# Patient Record
Sex: Female | Born: 1965 | Race: Black or African American | Hispanic: No | Smoking: Never smoker
Health system: Southern US, Community
[De-identification: ages and names within clinical notes are randomized; demographics above are authoritative.]

## PROBLEM LIST (undated history)

## (undated) DIAGNOSIS — Z973 Presence of spectacles and contact lenses: Secondary | ICD-10-CM

## (undated) DIAGNOSIS — R5383 Other fatigue: Secondary | ICD-10-CM

## (undated) HISTORY — DX: Other fatigue: R53.83

## (undated) HISTORY — DX: Presence of spectacles and contact lenses: Z97.3

---

## 2004-12-22 ENCOUNTER — Ambulatory Visit (HOSPITAL_COMMUNITY): Admission: RE | Admit: 2004-12-22 | Discharge: 2004-12-22 | Payer: Self-pay | Admitting: Obstetrics and Gynecology

## 2005-07-16 ENCOUNTER — Emergency Department (HOSPITAL_COMMUNITY): Admission: EM | Admit: 2005-07-16 | Discharge: 2005-07-16 | Payer: Self-pay | Admitting: Emergency Medicine

## 2005-09-05 ENCOUNTER — Encounter: Admission: RE | Admit: 2005-09-05 | Discharge: 2005-09-05 | Payer: Self-pay | Admitting: Family Medicine

## 2006-09-17 ENCOUNTER — Emergency Department (HOSPITAL_COMMUNITY): Admission: EM | Admit: 2006-09-17 | Discharge: 2006-09-17 | Payer: Self-pay | Admitting: Emergency Medicine

## 2006-12-10 ENCOUNTER — Encounter: Admission: RE | Admit: 2006-12-10 | Discharge: 2006-12-10 | Payer: Self-pay | Admitting: Family Medicine

## 2007-12-11 ENCOUNTER — Encounter: Admission: RE | Admit: 2007-12-11 | Discharge: 2007-12-11 | Payer: Self-pay | Admitting: Family Medicine

## 2008-12-12 ENCOUNTER — Encounter: Admission: RE | Admit: 2008-12-12 | Discharge: 2008-12-12 | Payer: Self-pay | Admitting: Family Medicine

## 2009-06-16 ENCOUNTER — Emergency Department (HOSPITAL_COMMUNITY): Admission: EM | Admit: 2009-06-16 | Discharge: 2009-06-16 | Payer: Self-pay | Admitting: Emergency Medicine

## 2009-12-13 ENCOUNTER — Encounter: Admission: RE | Admit: 2009-12-13 | Discharge: 2009-12-13 | Payer: Self-pay | Admitting: Family Medicine

## 2010-04-09 LAB — POCT I-STAT, CHEM 8
BUN: 11 mg/dL (ref 6–23)
Creatinine, Ser: 0.5 mg/dL (ref 0.4–1.2)
Glucose, Bld: 92 mg/dL (ref 70–99)
Sodium: 140 mEq/L (ref 135–145)
TCO2: 23 mmol/L (ref 0–100)

## 2010-11-01 ENCOUNTER — Inpatient Hospital Stay (INDEPENDENT_AMBULATORY_CARE_PROVIDER_SITE_OTHER)
Admission: RE | Admit: 2010-11-01 | Discharge: 2010-11-01 | Disposition: A | Discharge status: Home / Self Care | Payer: PRIVATE HEALTH INSURANCE | Source: Ambulatory Visit | Attending: Family Medicine | Admitting: Family Medicine

## 2010-11-01 DIAGNOSIS — M79609 Pain in unspecified limb: Secondary | ICD-10-CM

## 2010-11-02 LAB — URINALYSIS, ROUTINE W REFLEX MICROSCOPIC
Bilirubin Urine: NEGATIVE
Glucose, UA: NEGATIVE
Nitrite: NEGATIVE
Specific Gravity, Urine: 1.027
pH: 6

## 2010-11-02 LAB — POCT PREGNANCY, URINE: Operator id: 277751

## 2010-11-23 ENCOUNTER — Other Ambulatory Visit (HOSPITAL_COMMUNITY): Payer: Self-pay | Admitting: Family Medicine

## 2010-11-23 DIAGNOSIS — Z1231 Encounter for screening mammogram for malignant neoplasm of breast: Secondary | ICD-10-CM

## 2010-12-20 ENCOUNTER — Ambulatory Visit (HOSPITAL_COMMUNITY)
Admission: RE | Admit: 2010-12-20 | Discharge: 2010-12-20 | Disposition: A | Discharge status: Home / Self Care | Payer: PRIVATE HEALTH INSURANCE | Source: Ambulatory Visit | Attending: Family Medicine | Admitting: Family Medicine

## 2010-12-20 DIAGNOSIS — Z1231 Encounter for screening mammogram for malignant neoplasm of breast: Secondary | ICD-10-CM

## 2012-02-23 ENCOUNTER — Emergency Department (HOSPITAL_COMMUNITY)
Admission: EM | Admit: 2012-02-23 | Discharge: 2012-02-23 | Disposition: A | Discharge status: Home / Self Care | Payer: PRIVATE HEALTH INSURANCE | Attending: Emergency Medicine | Admitting: Emergency Medicine

## 2012-02-23 ENCOUNTER — Emergency Department (HOSPITAL_COMMUNITY): Payer: PRIVATE HEALTH INSURANCE

## 2012-02-23 ENCOUNTER — Encounter (HOSPITAL_COMMUNITY): Payer: Self-pay

## 2012-02-23 DIAGNOSIS — K8020 Calculus of gallbladder without cholecystitis without obstruction: Secondary | ICD-10-CM | POA: Insufficient documentation

## 2012-02-23 DIAGNOSIS — Z3202 Encounter for pregnancy test, result negative: Secondary | ICD-10-CM | POA: Insufficient documentation

## 2012-02-23 DIAGNOSIS — R109 Unspecified abdominal pain: Secondary | ICD-10-CM | POA: Insufficient documentation

## 2012-02-23 LAB — COMPREHENSIVE METABOLIC PANEL
ALT: 8 U/L (ref 0–35)
AST: 15 U/L (ref 0–37)
CO2: 26 mEq/L (ref 19–32)
Calcium: 8.9 mg/dL (ref 8.4–10.5)
Chloride: 102 mEq/L (ref 96–112)
GFR calc non Af Amer: >90 mL/min (ref >90)
Sodium: 136 mEq/L (ref 135–145)

## 2012-02-23 LAB — CBC WITH DIFFERENTIAL/PLATELET
Basophils Absolute: 0 10*3/uL (ref 0.0–0.1)
Eosinophils Relative: 1 % (ref 0–5)
Lymphocytes Relative: 16 % (ref 12–46)
Neutro Abs: 6.1 10*3/uL (ref 1.7–7.7)
Neutrophils Relative %: 77 % (ref 43–77)
Platelets: 226 10*3/uL (ref 150–400)
RDW: 13.5 % (ref 11.5–15.5)
WBC: 7.8 10*3/uL (ref 4.0–10.5)

## 2012-02-23 LAB — URINALYSIS, ROUTINE W REFLEX MICROSCOPIC
Leukocytes, UA: NEGATIVE
Nitrite: NEGATIVE
Protein, ur: NEGATIVE mg/dL
Urobilinogen, UA: 1 mg/dL (ref 0.0–1.0)

## 2012-02-23 LAB — URINE MICROSCOPIC-ADD ON

## 2012-02-23 MED ORDER — MORPHINE SULFATE 4 MG/ML IJ SOLN
6.0000 mg | Freq: Once | INTRAMUSCULAR | Status: AC
  Administered 2012-02-23: 6 mg via INTRAVENOUS
  Filled 2012-02-23: qty 2

## 2012-02-23 NOTE — ED Notes (Signed)
US at bedside

## 2012-02-23 NOTE — ED Notes (Signed)
Pt c/o "contraction-like" upper abdominal pain starting this morning.  Pain score 10/10.  Denies n/v/d.  Vitals are stable.

## 2012-02-23 NOTE — ED Notes (Signed)
RN to obtain labs with start of IV 

## 2012-02-23 NOTE — ED Provider Notes (Signed)
History     CSN: [id number]  Arrival date & time 02/23/12  4098   First MD Initiated Contact with Patient 02/23/12 [phone]      Chief Complaint  Patient presents with  . Abdominal Pain     The history is provided by the patient.   patient reports being awoken by upper abdominal pain that has been colicky in nature.  No nausea or vomiting.  No fevers or chills.  No urinary complaints.  No flank pain.  She reports her pain seems to be "contraction-like" in her upper abdomen.  The patient was asked to pressure on her stomach where it is most tender she points just to the left of her umbilicus.  No history of diverticulitis.  No medical problems for this patient.  No recent illness prior to today.  Her symptoms were abrupt in onset.  Her pain is mild to moderate at this time.  Nothing worsens or improves her pain.  Her pain continues to wax and wane  History reviewed. No pertinent past medical history.  History reviewed. No pertinent past surgical history.  History reviewed. No pertinent family history.  History  Substance Use Topics  . Smoking status: Never Smoker   . Smokeless tobacco: Not on file  . Alcohol Use: Yes     Comment: occasionally    OB History    Grav Para Term Preterm Abortions TAB SAB Ect Mult Living                  Review of Systems  Gastrointestinal: Positive for abdominal pain.  All other systems reviewed and are negative.    Allergies  Review of patient's allergies indicates no known allergies.  Home Medications  No current outpatient prescriptions on file.  BP 119/82  Pulse 104  Temp 98.1 F (36.7 C) (Oral)  Resp 18  SpO2 100%  LMP 02/10/2012  Physical Exam  Nursing note and vitals reviewed. Constitutional: She is oriented to person, place, and time. She appears well-developed and well-nourished. No distress.  HENT:  Head: Normocephalic and atraumatic.  Eyes: EOM are normal.  Neck: Normal range of motion.  Cardiovascular: Normal rate,  regular rhythm and normal heart sounds.   Pulmonary/Chest: Effort normal and breath sounds normal.  Abdominal: Soft. She exhibits no distension.       Mild left lower quadrant and left-sided abdominal tenderness.  No right upper quadrant tenderness.  No Murphy sign present.  Musculoskeletal: Normal range of motion.  Neurological: She is alert and oriented to person, place, and time.  Skin: Skin is warm and dry.  Psychiatric: She has a normal mood and affect. Judgment normal.    ED Course  Procedures (including critical care time)  Labs Reviewed  URINALYSIS, ROUTINE W REFLEX MICROSCOPIC - Abnormal; Notable for the following:    APPearance TURBID (*)     All other components within normal limits  CBC WITH DIFFERENTIAL - Abnormal; Notable for the following:    Hemoglobin 11.6 (*)     HCT 35.3 (*)     All other components within normal limits  COMPREHENSIVE METABOLIC PANEL - Abnormal; Notable for the following:    Potassium 3.4 (*)     All other components within normal limits  PREGNANCY, URINE  LIPASE, BLOOD  URINE MICROSCOPIC-ADD ON   US Abdomen Complete  02/23/2012  *RADIOLOGY REPORT*  Clinical Data:  Abdominal pain  COMPLETE ABDOMINAL ULTRASOUND  Comparison:  None.  Findings:  Gallbladder:  Note is made of an  approximately 2 mm nondependent structure with an otherwise normal gallbladder.  No gallbladder wall thickening or pericholecystic fluid.  Negative sonographic Murphy's sign.  Common bile duct:  Normal in size measuring 5 mm in diameter  Liver:  Homogeneous hepatic echotexture.  No discrete hepatic lesions.  No definite evidence of intrahepatic biliary ductal dilatation.  No ascites.  IVC:  Appears normal.  Pancreas:  Limited visualization of the pancreatic head and neck is normal.  Visualization of the pancreatic body and tail is obscured by bowel gas. No definite pancreatic ductal dilatation.  Spleen:  Normal in size measuring 5 cm in length  Right Kidney:  Normal cortical  thickness, echogenicity and size, measuring 9.9 cm in length.  No focal renal lesions.  No echogenic renal stones.  No urinary obstruction.  Left Kidney:  Normal cortical thickness, echogenicity and size, measuring 9.32 cm in length.  Incidental note is made of a prominent renal column Burtin, similar to the contralateral right kidney.  No focal renal lesions.  No echogenic renal stones.  No urinary obstruction.  Abdominal aorta:  No aneurysm identified.  IMPRESSION:  1.  No explanation for patient's abdominal pain.  Specifically, no evidence of cholecystitis or urinary obstruction. 2. Approximately 2 mm echogenic polyp verses adherent stone with an otherwise normal-appearing gallbladder.   Original Report Authenticated By: Tacey Ruiz, MD    I personally reviewed the imaging tests through PACS system I reviewed available ER/hospitalization records through the EMR   1. Abdominal pain   2. Cholelithiases       MDM  1:34 PM The patient feels much better at this time.  This may represent biliary colic.  General surgery followup.  Polyp versus gallstone.  LFTs and lipase normal.  The patient was crepitus and pain medicine.  Discharge home in good condition.        Lyanne Co, MD 02/23/12 [phone]

## 2012-02-23 NOTE — ED Notes (Signed)
Pt requested copy of pt summary

## 2012-02-26 ENCOUNTER — Ambulatory Visit: Payer: Self-pay

## 2012-02-28 ENCOUNTER — Other Ambulatory Visit (INDEPENDENT_AMBULATORY_CARE_PROVIDER_SITE_OTHER): Payer: Self-pay | Admitting: Surgery

## 2012-02-28 ENCOUNTER — Encounter (INDEPENDENT_AMBULATORY_CARE_PROVIDER_SITE_OTHER): Payer: Self-pay | Admitting: Surgery

## 2012-02-28 ENCOUNTER — Ambulatory Visit (INDEPENDENT_AMBULATORY_CARE_PROVIDER_SITE_OTHER): Payer: PRIVATE HEALTH INSURANCE | Admitting: Surgery

## 2012-02-28 VITALS — BP 124/70 | HR 72 | Temp 98.3°F | Resp 16 | Ht 64.5 in | Wt 129.4 lb

## 2012-02-28 DIAGNOSIS — R109 Unspecified abdominal pain: Secondary | ICD-10-CM

## 2012-02-28 DIAGNOSIS — K824 Cholesterolosis of gallbladder: Secondary | ICD-10-CM

## 2012-02-28 DIAGNOSIS — K8020 Calculus of gallbladder without cholecystitis without obstruction: Secondary | ICD-10-CM

## 2012-02-28 NOTE — Progress Notes (Signed)
Patient ID: Erin Singh, female   DOB: [birth year], 47 y.o.   MRN: [id number]  Chief Complaint  Patient presents with  . Cholelithiasis    HPI Erin Singh is a 47 y.o. female.  Referred by Dr. Azalia Bilis,  [facility] emergency department. Primary care physician is Dr. Viann Shove HPI This patient presents after recent emergency department visit. One week ago she had a large cheeseburger from [employer].  She awoke in the middle of the night with severe abdominal pain and bloating. She tried to self medicate with Pepto-Bismol but was brought to the emergency department around 8 AM. She was given some intravenous pain medication and the symptoms improved. She did have one episode of nausea vomiting but this was after the morphine. She denies any abdominal bloating. She denies any diarrhea. A couple of days later she had another cheeseburger and had some mild abdominal pain. She denies any jaundice or any changes in her bowel movements or urine.  Past Medical History  Diagnosis Date  . Fatigue     due to loss of sleep  . Wears glasses     History reviewed. No pertinent past surgical history.  Family History  Problem Relation Age of Onset  . Diabetes Mother   . Kidney failure Mother   . Other Mother     double amputee    Social History History  Substance Use Topics  . Smoking status: Never Smoker   . Smokeless tobacco: Never Used  . Alcohol Use: Yes     Comment: occasionally - rare    No Known Allergies  No current outpatient prescriptions on file.    Review of Systems Review of Systems  Constitutional: Negative for fever, chills and unexpected weight change.  HENT: Negative for hearing loss, congestion, sore throat, trouble swallowing and voice change.   Eyes: Negative for visual disturbance.  Respiratory: Negative for cough and wheezing.   Cardiovascular: Negative for chest pain, palpitations and leg swelling.  Gastrointestinal: Positive for nausea, vomiting,  abdominal pain and abdominal distention. Negative for diarrhea, constipation, blood in stool and anal bleeding.  Genitourinary: Negative for hematuria, vaginal bleeding and difficulty urinating.  Musculoskeletal: Negative for arthralgias.  Skin: Negative for rash and wound.  Neurological: Negative for seizures, syncope and headaches.  Hematological: Negative for adenopathy. Does not bruise/bleed easily.  Psychiatric/Behavioral: Negative for confusion.    Blood pressure 124/70, pulse 72, temperature 98.3 F (36.8 C), temperature source Temporal, resp. rate 16, height 5' 4.5" (1.638 m), weight 129 lb 6 oz (58.684 kg), last menstrual period 02/10/2012.  Physical Exam Physical Exam WDWN in NAD HEENT:  EOMI, sclera anicteric Neck:  No masses, no thyromegaly Lungs:  CTA bilaterally; normal respiratory effort CV:  Regular rate and rhythm; no murmurs Abd:  +bowel sounds, soft, non-tender, no masses Ext:  Well-perfused; no edema Skin:  Warm, dry; no sign of jaundice  Data Reviewed *RADIOLOGY REPORT*  Clinical Data: Abdominal pain  COMPLETE ABDOMINAL ULTRASOUND  Comparison: None.  Findings:  Gallbladder: Note is made of an approximately 2 mm nondependent  structure with an otherwise normal gallbladder. No gallbladder  wall thickening or pericholecystic fluid. Negative sonographic  Murphy's sign.  Common bile duct: Normal in size measuring 5 mm in diameter  Liver: Homogeneous hepatic echotexture. No discrete hepatic  lesions. No definite evidence of intrahepatic biliary ductal  dilatation. No ascites.  IVC: Appears normal.  Pancreas: Limited visualization of the pancreatic head and neck is  normal. Visualization of the  pancreatic body and tail is obscured  by bowel gas. No definite pancreatic ductal dilatation.  Spleen: Normal in size measuring 5 cm in length  Right Kidney: Normal cortical thickness, echogenicity and size,  measuring 9.9 cm in length. No focal renal lesions. No  echogenic  renal stones. No urinary obstruction.  Left Kidney: Normal cortical thickness, echogenicity and size,  measuring 9.32 cm in length. Incidental note is made of a  prominent renal column Burtin, similar to the contralateral right  kidney. No focal renal lesions. No echogenic renal stones. No  urinary obstruction.  Abdominal aorta: No aneurysm identified.  IMPRESSION:  1. No explanation for patient's abdominal pain. Specifically, no  evidence of cholecystitis or urinary obstruction.  2. Approximately 2 mm echogenic polyp verses adherent stone with an  otherwise normal-appearing gallbladder.  Original Report Authenticated By: Tacey Ruiz, MD   Lab Results  Component Value Date   WBC 7.8 02/23/2012   HGB 11.6* 02/23/2012   HCT 35.3* 02/23/2012   MCV 87.6 02/23/2012   PLT 226 02/23/2012   Lab Results  Component Value Date   CREATININE 0.65 02/23/2012   BUN 10 02/23/2012   NA 136 02/23/2012   K 3.4* 02/23/2012   CL 102 02/23/2012   CO2 26 02/23/2012   Lab Results  Component Value Date   ALT 8 02/23/2012   AST 15 02/23/2012   ALKPHOS 50 02/23/2012   BILITOT 0.3 02/23/2012     Assessment    Abdominal pain - possible adherent 2 mm gallstone vs. Gallbladder polyp.  It is unclear with this tiny stone or polyp is causing her symptoms. Her symptoms seem relatively atypical for gallbladder disease.   Plan     We will obtain a HIDA scan to see if she has any recurrence of her symptoms. We will speak with her after the HIDA scan is complete.        [name] 02/28/2012, 1:13 PM

## 2012-02-28 NOTE — Patient Instructions (Signed)
We will call you with the results of your HIDA scan.

## 2012-03-09 ENCOUNTER — Telehealth (INDEPENDENT_AMBULATORY_CARE_PROVIDER_SITE_OTHER): Payer: Self-pay | Admitting: General Surgery

## 2012-03-09 ENCOUNTER — Ambulatory Visit (HOSPITAL_COMMUNITY)
Admission: RE | Admit: 2012-03-09 | Discharge: 2012-03-09 | Disposition: A | Discharge status: Home / Self Care | Payer: PRIVATE HEALTH INSURANCE | Source: Ambulatory Visit | Attending: Surgery | Admitting: Surgery

## 2012-03-09 DIAGNOSIS — R109 Unspecified abdominal pain: Secondary | ICD-10-CM | POA: Insufficient documentation

## 2012-03-09 DIAGNOSIS — K8020 Calculus of gallbladder without cholecystitis without obstruction: Secondary | ICD-10-CM

## 2012-03-09 MED ORDER — TECHNETIUM TC 99M MEBROFENIN IV KIT
5.4000 | PACK | Freq: Once | INTRAVENOUS | Status: AC | PRN
  Administered 2012-03-09: 5 via INTRAVENOUS

## 2012-03-09 NOTE — Telephone Encounter (Addendum)
Called patient to let know that her that her HIDA scan was normal and I asked her about the symptoms from drinking the Ensure and she stated like it was drinking water that she drinks it all the time. She does want you to call her next week. She is aware that you may call Monday or Tuesday and she is ok with that. And I also told her if she wants to come in to call and ask for [facility]

## 2012-03-09 NOTE — Progress Notes (Signed)
Quick Note:  Please call the patient and let them know that their HIDA scan was normal. Ask her if she had any symptoms after drinking the Ensure. I can talk to her about this when I get back next week. If she wants to talk face to face, go ahead and schedule her for a follow-up visit.  Thanks  ______

## 2012-03-10 ENCOUNTER — Ambulatory Visit: Payer: Self-pay

## 2012-03-16 ENCOUNTER — Telehealth (INDEPENDENT_AMBULATORY_CARE_PROVIDER_SITE_OTHER): Payer: Self-pay | Admitting: General Surgery

## 2012-03-16 NOTE — Telephone Encounter (Signed)
[facility] for patient call back and ask for [facility]

## 2013-02-23 ENCOUNTER — Ambulatory Visit: Payer: Self-pay | Admitting: Family Medicine

## 2013-02-26 ENCOUNTER — Ambulatory Visit: Payer: Self-pay | Admitting: Family Medicine

## 2014-02-20 IMAGING — US US ABDOMEN COMPLETE
1 series · 13 of 25 positions shown · non-contrast
Comparison: None.

CLINICAL DATA: Abdominal pain

COMPLETE ABDOMINAL ULTRASOUND

[Series 1: us abdomen complete · 0.30mm/px · 13 of 65 slices shown]
[im 1/65]
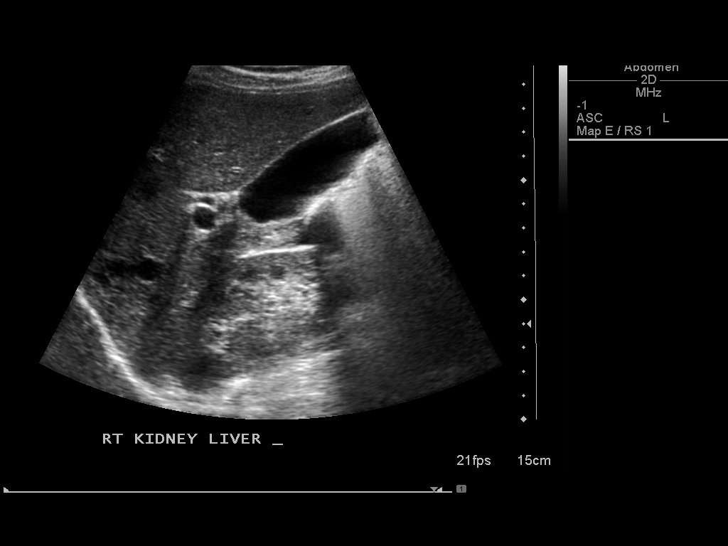
[im 6/65]
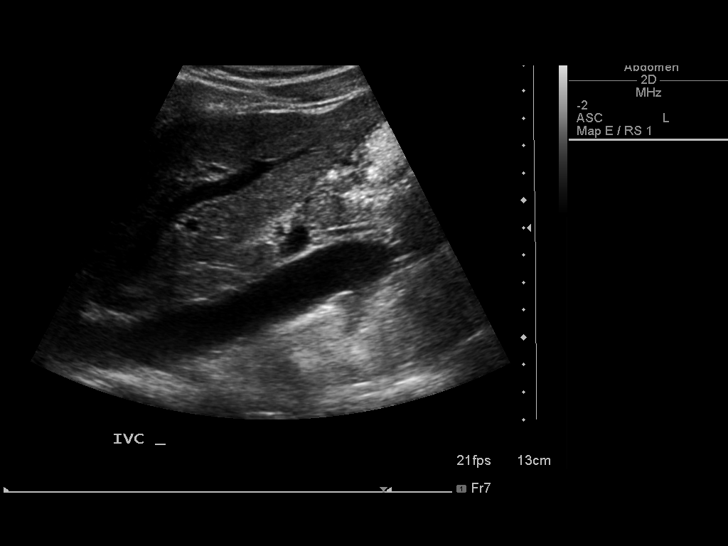
[im 11/65]
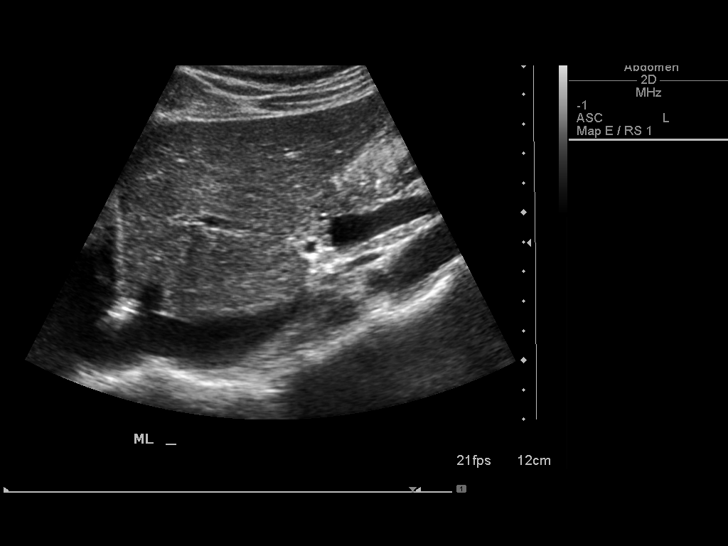
[im 17/65]
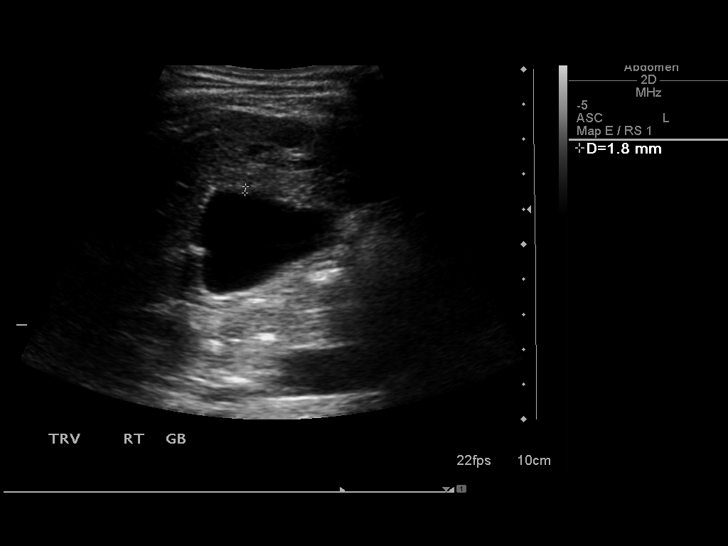
[im 22/65]
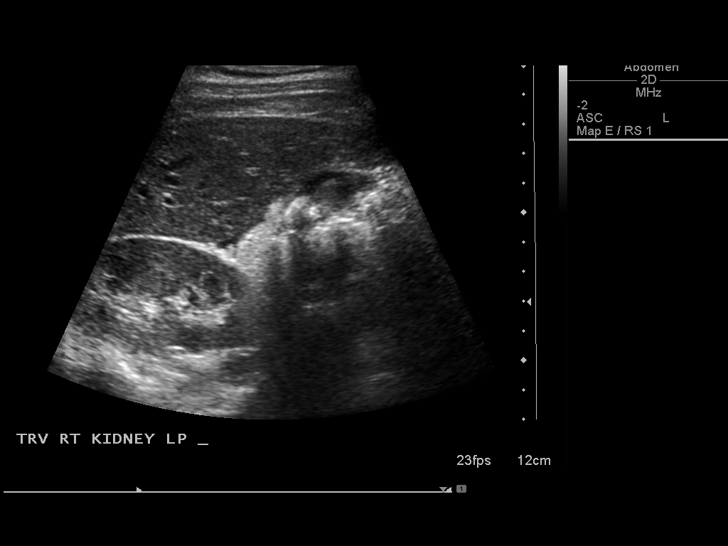
[im 27/65]
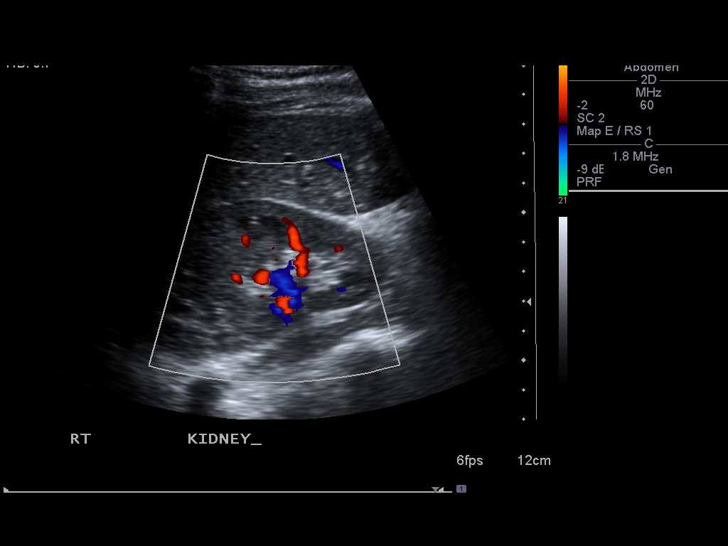
[im 33/65]
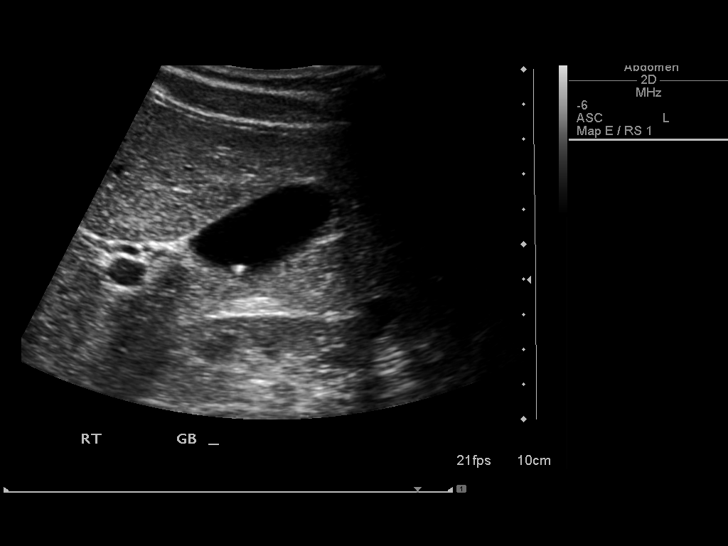
[im 38/65]
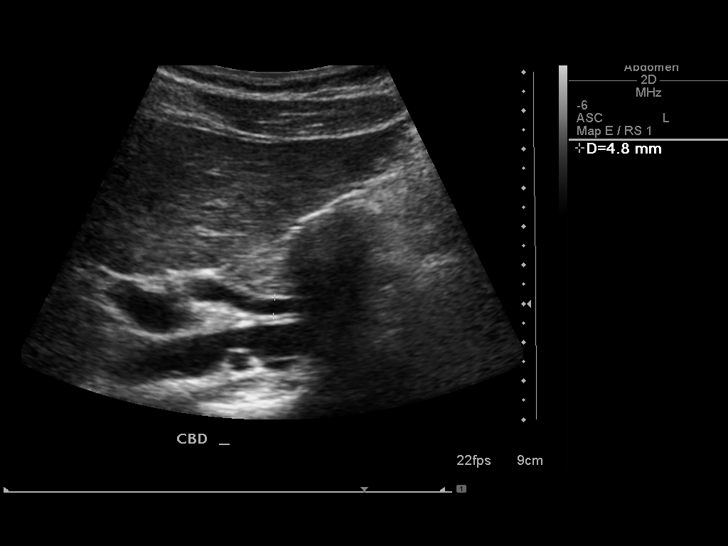
[im 43/65]
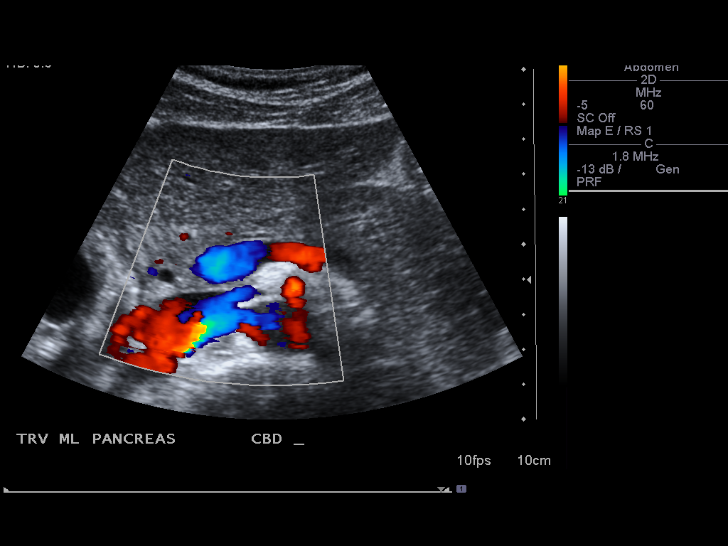
[im 49/65]
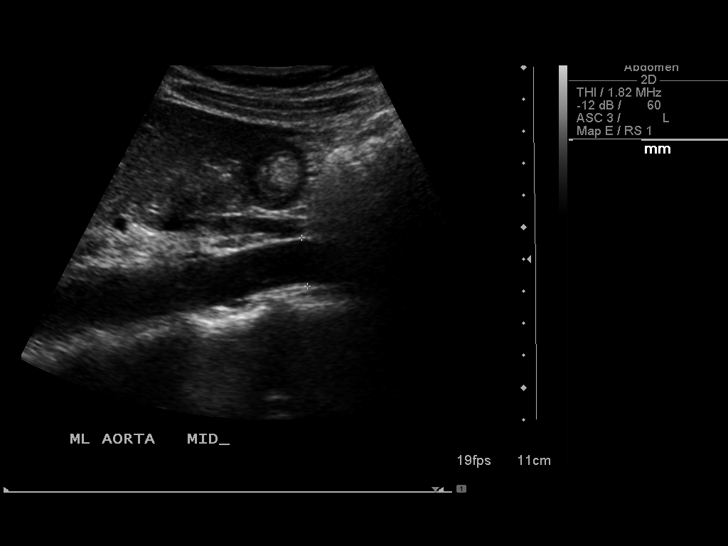
[im 54/65]
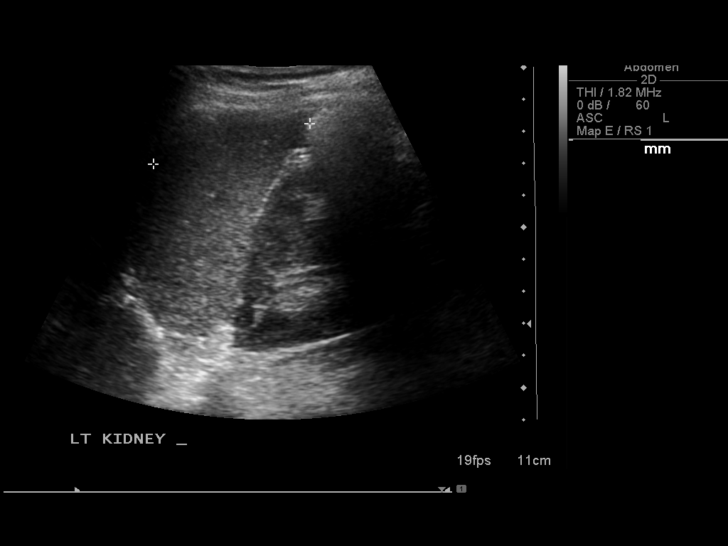
[im 59/65]
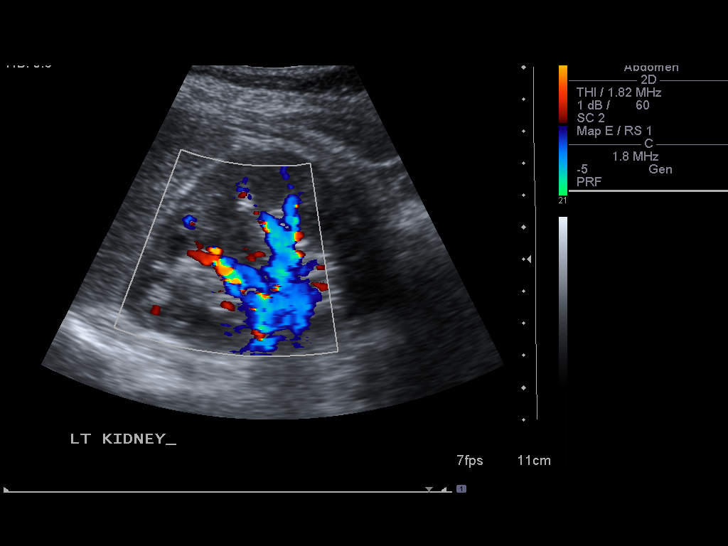
[im 65/65]
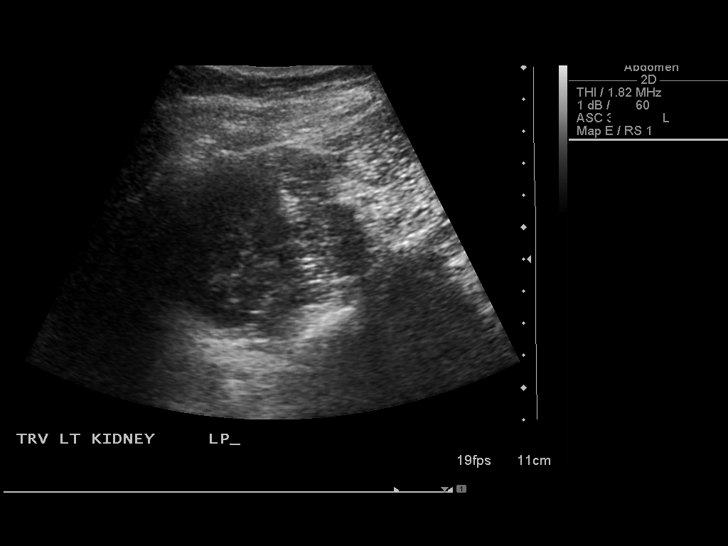

[13 of 25 positions shown; findings below may reference images not displayed]

FINDINGS: Gallbladder:  Note is made of an approximately 2 mm nondependent
structure with an otherwise normal gallbladder.  No gallbladder
wall thickening or pericholecystic fluid.  Negative sonographic
Murphy's sign.

Common bile duct:  Normal in size measuring 5 mm in diameter

Liver:  Homogeneous hepatic echotexture.  No discrete hepatic
lesions.  No definite evidence of intrahepatic biliary ductal
dilatation.  No ascites.

IVC:  Appears normal.

Pancreas:  Limited visualization of the pancreatic head and neck is
normal.  Visualization of the pancreatic body and tail is obscured
by bowel gas. No definite pancreatic ductal dilatation.

Spleen:  Normal in size measuring 5 cm in length

Right Kidney:  Normal cortical thickness, echogenicity and size,
measuring 9.9 cm in length.  No focal renal lesions.  No echogenic
renal stones.  No urinary obstruction.

Left Kidney:  Normal cortical thickness, echogenicity and size,
measuring 9.32 cm in length.  Incidental note is made of a
prominent renal column Zvirbule, similar to the contralateral right
kidney.  No focal renal lesions.  No echogenic renal stones.  No
urinary obstruction.

Abdominal aorta:  No aneurysm identified.
IMPRESSION: 1.  No explanation for patient's abdominal pain.  Specifically, no
evidence of cholecystitis or urinary obstruction.
2. Approximately 2 mm echogenic polyp verses adherent stone with an
otherwise normal-appearing gallbladder.

## 2014-02-22 ENCOUNTER — Other Ambulatory Visit (HOSPITAL_COMMUNITY): Payer: Self-pay | Admitting: Family Medicine

## 2014-02-22 DIAGNOSIS — Z1231 Encounter for screening mammogram for malignant neoplasm of breast: Secondary | ICD-10-CM

## 2014-02-28 ENCOUNTER — Ambulatory Visit (HOSPITAL_COMMUNITY)
Admission: RE | Admit: 2014-02-28 | Discharge: 2014-02-28 | Disposition: A | Discharge status: Home / Self Care | Payer: BLUE CROSS/BLUE SHIELD | Source: Ambulatory Visit | Attending: Family Medicine | Admitting: Family Medicine

## 2014-02-28 DIAGNOSIS — Z1231 Encounter for screening mammogram for malignant neoplasm of breast: Secondary | ICD-10-CM | POA: Insufficient documentation

## 2014-05-14 ENCOUNTER — Emergency Department (INDEPENDENT_AMBULATORY_CARE_PROVIDER_SITE_OTHER)
Admission: EM | Admit: 2014-05-14 | Discharge: 2014-05-14 | Disposition: A | Discharge status: Home / Self Care | Payer: BLUE CROSS/BLUE SHIELD | Source: Home / Self Care | Attending: Family Medicine | Admitting: Family Medicine

## 2014-05-14 ENCOUNTER — Encounter (HOSPITAL_COMMUNITY): Payer: Self-pay | Admitting: Emergency Medicine

## 2014-05-14 DIAGNOSIS — J309 Allergic rhinitis, unspecified: Secondary | ICD-10-CM | POA: Diagnosis not present

## 2014-05-14 MED ORDER — METHYLPREDNISOLONE ACETATE 80 MG/ML IJ SUSP
80.0000 mg | Freq: Once | INTRAMUSCULAR | Status: AC
  Administered 2014-05-14: 80 mg via INTRAMUSCULAR

## 2014-05-14 MED ORDER — KETOROLAC TROMETHAMINE 60 MG/2ML IM SOLN
INTRAMUSCULAR | Status: AC
  Filled 2014-05-14: qty 2

## 2014-05-14 MED ORDER — METHYLPREDNISOLONE ACETATE 80 MG/ML IJ SUSP
INTRAMUSCULAR | Status: AC
  Filled 2014-05-14: qty 1

## 2014-05-14 MED ORDER — KETOROLAC TROMETHAMINE 60 MG/2ML IM SOLN
60.0000 mg | Freq: Once | INTRAMUSCULAR | Status: AC
  Administered 2014-05-14: 60 mg via INTRAMUSCULAR

## 2014-05-14 NOTE — ED Provider Notes (Signed)
Erin CarbonKathy L Abraha McWilliams is a 49 y.o. female who presents to Urgent Care today for right-sided facial pain and pressure associated with headache. Symptoms present over the last few days. She's tried some over-the-counter medications which helped a little. She also notes sneezing and runny nose. She was treated recently for sinusitis with what sounds like a steroid intermuscular injection. No fevers or chills vomiting or diarrhea. No allergy medicines tried yet.   Past Medical History  Diagnosis Date  . Fatigue     due to loss of sleep  . Wears glasses    History reviewed. No pertinent past surgical history. History  Substance Use Topics  . Smoking status: Never Smoker   . Smokeless tobacco: Never Used  . Alcohol Use: Yes     Comment: occasionally - rare   ROS as above Medications: No current facility-administered medications for this encounter.   No current outpatient prescriptions on file.   No Known Allergies   Exam:  BP 144/60 mmHg  Pulse 69  Temp(Src) 98.3 F (36.8 C) (Oral)  Resp 16  LMP 01/31/2014 Gen: Well NAD HEENT: EOMI,  MMM inflamed nasal turbinates bilaterally Sinuses and frontal sinuses are nontender. Posterior pharynx with cobblestoning. Normal tympanic membranes bilaterally. Lungs: Normal work of breathing. CTABL Heart: RRR no MRG Abd: NABS, Soft. Nondistended, Nontender Exts: Brisk capillary refill, warm and well perfused.   Patient was given 60 mg IM Toradol, and 80 mg IM Depo-Medrol prior to discharge.  No results found for this or any previous visit (from the past 24 hour(s)). No results found.  Assessment and Plan: 49 y.o. female with allergic sinusitis and headache. Doubtful for bacterial sinusitis. Treat with medications as above as well as Rhinocort and Zyrtec.  Discussed warning signs or symptoms. Please see discharge instructions. Patient expresses understanding.     Rodolph BongEvan S Resha Filippone, MD 05/14/14 [phone]

## 2014-05-14 NOTE — ED Notes (Signed)
C/o headache States she has a nonproductive cough and sneezing States pain radiates down to back of head Was given a shot at primary office for same sx

## 2014-05-14 NOTE — Discharge Instructions (Signed)
Thank you for coming in today.  Use over the counter rhinocort and zyrtec.   Sinusitis Sinusitis is redness, soreness, and inflammation of the paranasal sinuses. Paranasal sinuses are air pockets within the bones of your face (beneath the eyes, the middle of the forehead, or above the eyes). In healthy paranasal sinuses, mucus is able to drain out, and air is able to circulate through them by way of your nose. However, when your paranasal sinuses are inflamed, mucus and air can become trapped. This can allow bacteria and other germs to grow and cause infection. Sinusitis can develop quickly and last only a short time (acute) or continue over a long period (chronic). Sinusitis that lasts for more than 12 weeks is considered chronic.  CAUSES  Causes of sinusitis include:  Allergies.  Structural abnormalities, such as displacement of the cartilage that separates your nostrils (deviated septum), which can decrease the air flow through your nose and sinuses and affect sinus drainage.  Functional abnormalities, such as when the small hairs (cilia) that line your sinuses and help remove mucus do not work properly or are not present. SIGNS AND SYMPTOMS  Symptoms of acute and chronic sinusitis are the same. The primary symptoms are pain and pressure around the affected sinuses. Other symptoms include:  Upper toothache.  Earache.  Headache.  Bad breath.  Decreased sense of smell and taste.  A cough, which worsens when you are lying flat.  Fatigue.  Fever.  Thick drainage from your nose, which often is green and may contain pus (purulent).  Swelling and warmth over the affected sinuses. DIAGNOSIS  Your health care provider will perform a physical exam. During the exam, your health care provider may:  Look in your nose for signs of abnormal growths in your nostrils (nasal polyps).  Tap over the affected sinus to check for signs of infection.  View the inside of your sinuses  (endoscopy) using an imaging device that has a light attached (endoscope). If your health care provider suspects that you have chronic sinusitis, one or more of the following tests may be recommended:  Allergy tests.  Nasal culture. A sample of mucus is taken from your nose, sent to a lab, and screened for bacteria.  Nasal cytology. A sample of mucus is taken from your nose and examined by your health care provider to determine if your sinusitis is related to an allergy. TREATMENT  Most cases of acute sinusitis are related to a viral infection and will resolve on their own within 10 days. Sometimes medicines are prescribed to help relieve symptoms (pain medicine, decongestants, nasal steroid sprays, or saline sprays).  However, for sinusitis related to a bacterial infection, your health care provider will prescribe antibiotic medicines. These are medicines that will help kill the bacteria causing the infection.  Rarely, sinusitis is caused by a fungal infection. In theses cases, your health care provider will prescribe antifungal medicine. For some cases of chronic sinusitis, surgery is needed. Generally, these are cases in which sinusitis recurs more than 3 times per year, despite other treatments. HOME CARE INSTRUCTIONS   Drink plenty of water. Water helps thin the mucus so your sinuses can drain more easily.  Use a humidifier.  Inhale steam 3 to 4 times a day (for example, sit in the bathroom with the shower running).  Apply a warm, moist washcloth to your face 3 to 4 times a day, or as directed by your health care provider.  Use saline nasal sprays to help moisten  and clean your sinuses.  Take medicines only as directed by your health care provider.  If you were prescribed either an antibiotic or antifungal medicine, finish it all even if you start to feel better. SEEK IMMEDIATE MEDICAL CARE IF:  You have increasing pain or severe headaches.  You have nausea, vomiting, or  drowsiness.  You have swelling around your face.  You have vision problems.  You have a stiff neck.  You have difficulty breathing. MAKE SURE YOU:   Understand these instructions.  Will watch your condition.  Will get help right away if you are not doing well or get worse. Document Released: 01/07/2005 Document Revised: 05/24/2013 Document Reviewed: 01/22/2011 [facility] Patient Information 2015 [place], [place]. This information is not intended to replace advice given to you by your health care provider. Make sure you discuss any questions you have with your health care provider.

## 2014-11-20 ENCOUNTER — Emergency Department (HOSPITAL_COMMUNITY)
Admission: EM | Admit: 2014-11-20 | Discharge: 2014-11-21 | Disposition: A | Discharge status: Home / Self Care | Payer: BLUE CROSS/BLUE SHIELD | Attending: Emergency Medicine | Admitting: Emergency Medicine

## 2014-11-20 ENCOUNTER — Encounter (HOSPITAL_COMMUNITY): Payer: Self-pay | Admitting: Family Medicine

## 2014-11-20 DIAGNOSIS — Z3202 Encounter for pregnancy test, result negative: Secondary | ICD-10-CM | POA: Diagnosis not present

## 2014-11-20 DIAGNOSIS — Z008 Encounter for other general examination: Secondary | ICD-10-CM | POA: Diagnosis present

## 2014-11-20 DIAGNOSIS — Z0000 Encounter for general adult medical examination without abnormal findings: Secondary | ICD-10-CM

## 2014-11-20 LAB — CBC
HCT: 32.4 % — ABNORMAL LOW (ref 36.0–46.0)
HEMOGLOBIN: 10.4 g/dL — AB (ref 12.0–15.0)
MCH: 26.7 pg (ref 26.0–34.0)
MCHC: 32.1 g/dL (ref 30.0–36.0)
MCV: 83.3 fL (ref 78.0–100.0)
PLATELETS: 270 10*3/uL (ref 150–400)
RBC: 3.89 MIL/uL (ref 3.87–5.11)
RDW: 15.1 % (ref 11.5–15.5)
WBC: 5.5 10*3/uL (ref 4.0–10.5)
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
## 2014-11-20 NOTE — ED Notes (Addendum)
Pt belonging: shirt, pants, [place] id, cell phone, shoe
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
## 2014-11-21 LAB — RAPID URINE DRUG SCREEN, HOSP PERFORMED
Amphetamines: NOT DETECTED
BARBITURATES: NOT DETECTED
BENZODIAZEPINES: NOT DETECTED
COCAINE: NOT DETECTED
Opiates: NOT DETECTED
TETRAHYDROCANNABINOL: NOT DETECTED

## 2014-11-21 LAB — ETHANOL

## 2014-11-21 LAB — COMPREHENSIVE METABOLIC PANEL
ALK PHOS: 61 U/L (ref 38–126)
ALT: 11 U/L — AB (ref 14–54)
AST: 19 U/L (ref 15–41)
Albumin: 4.5 g/dL (ref 3.5–5.0)
Anion gap: 6 (ref 5–15)
BUN: 10 mg/dL (ref 6–20)
CALCIUM: 8.7 mg/dL — AB (ref 8.9–10.3)
CHLORIDE: 108 mmol/L (ref 101–111)
CO2: 22 mmol/L (ref 22–32)
Creatinine, Ser: 0.65 mg/dL (ref 0.44–1.00)
Glucose, Bld: 121 mg/dL — ABNORMAL HIGH (ref 65–99)
Potassium: 3.5 mmol/L (ref 3.5–5.1)
Sodium: 136 mmol/L (ref 135–145)
Total Bilirubin: 0.6 mg/dL (ref 0.3–1.2)
Total Protein: 8.2 g/dL — ABNORMAL HIGH (ref 6.5–8.1)

## 2014-11-21 LAB — ACETAMINOPHEN LEVEL: Acetaminophen (Tylenol), Serum: <10 ug/mL — ABNORMAL LOW (ref 10–30)

## 2014-11-21 LAB — POC URINE PREG, ED: PREG TEST UR: NEGATIVE

## 2014-11-21 LAB — SALICYLATE LEVEL
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
## 2014-11-21 NOTE — Discharge Instructions (Signed)
Follow up a mental health counselor if you feel you need it. Look at the list to get a provider. Return to the ED if you feel you are going to hurt yourself or someone else.

## 2014-11-21 NOTE — BH Assessment (Signed)
Assessment completed. Consulted Hulan FessIjeoma Nwaeze, NP who recommended that pt be evaluated by psychiatry in the morning. Informed Midge AverSabrina Sam, PA-C of recommendation.

## 2015-03-07 ENCOUNTER — Other Ambulatory Visit: Payer: Self-pay | Admitting: Family Medicine

## 2015-03-07 DIAGNOSIS — Z1231 Encounter for screening mammogram for malignant neoplasm of breast: Secondary | ICD-10-CM

## 2015-03-16 ENCOUNTER — Ambulatory Visit
Admission: RE | Admit: 2015-03-16 | Discharge: 2015-03-16 | Disposition: A | Discharge status: Home / Self Care | Payer: BLUE CROSS/BLUE SHIELD | Source: Ambulatory Visit | Attending: Family Medicine | Admitting: Family Medicine

## 2015-03-16 DIAGNOSIS — Z1231 Encounter for screening mammogram for malignant neoplasm of breast: Secondary | ICD-10-CM

## 2016-02-09 ENCOUNTER — Other Ambulatory Visit: Payer: Self-pay | Admitting: Family Medicine

## 2016-02-09 DIAGNOSIS — Z1231 Encounter for screening mammogram for malignant neoplasm of breast: Secondary | ICD-10-CM

## 2016-03-06 ENCOUNTER — Ambulatory Visit: Payer: Self-pay

## 2016-03-07 ENCOUNTER — Ambulatory Visit
Admission: RE | Admit: 2016-03-07 | Discharge: 2016-03-07 | Disposition: A | Discharge status: Home / Self Care | Payer: BLUE CROSS/BLUE SHIELD | Source: Ambulatory Visit | Attending: Family Medicine | Admitting: Family Medicine

## 2016-03-07 DIAGNOSIS — Z1231 Encounter for screening mammogram for malignant neoplasm of breast: Secondary | ICD-10-CM

## 2016-10-16 ENCOUNTER — Encounter: Payer: Self-pay | Admitting: Internal Medicine

## 2016-10-16 NOTE — Progress Notes (Signed)
Received labs from 10/11/2016:  - Glu 76, BUN/Cr 25/1.58, eGFR 41 - Lipids: 165/84/67/81 - vit D 39.2

## 2017-01-27 ENCOUNTER — Other Ambulatory Visit: Payer: Self-pay | Admitting: Family Medicine

## 2017-01-27 DIAGNOSIS — Z1231 Encounter for screening mammogram for malignant neoplasm of breast: Secondary | ICD-10-CM

## 2017-03-14 ENCOUNTER — Ambulatory Visit
Admission: RE | Admit: 2017-03-14 | Discharge: 2017-03-14 | Disposition: A | Discharge status: Home / Self Care | Payer: BLUE CROSS/BLUE SHIELD | Source: Ambulatory Visit | Attending: Family Medicine | Admitting: Family Medicine

## 2017-03-14 DIAGNOSIS — Z1231 Encounter for screening mammogram for malignant neoplasm of breast: Secondary | ICD-10-CM

## 2019-03-09 ENCOUNTER — Other Ambulatory Visit: Payer: Self-pay | Admitting: Family Medicine

## 2019-03-09 DIAGNOSIS — Z1231 Encounter for screening mammogram for malignant neoplasm of breast: Secondary | ICD-10-CM

## 2019-04-16 ENCOUNTER — Other Ambulatory Visit: Payer: Self-pay

## 2019-04-16 ENCOUNTER — Ambulatory Visit
Admission: RE | Admit: 2019-04-16 | Discharge: 2019-04-16 | Disposition: A | Discharge status: Home / Self Care | Payer: BC Managed Care – PPO | Source: Ambulatory Visit | Attending: Family Medicine | Admitting: Family Medicine

## 2019-04-16 DIAGNOSIS — Z1231 Encounter for screening mammogram for malignant neoplasm of breast: Secondary | ICD-10-CM

## 2020-03-13 ENCOUNTER — Other Ambulatory Visit: Payer: Self-pay | Admitting: Family Medicine

## 2020-03-13 DIAGNOSIS — Z1231 Encounter for screening mammogram for malignant neoplasm of breast: Secondary | ICD-10-CM

## 2020-05-05 ENCOUNTER — Other Ambulatory Visit: Payer: Self-pay

## 2020-05-05 ENCOUNTER — Ambulatory Visit
Admission: RE | Admit: 2020-05-05 | Discharge: 2020-05-05 | Disposition: A | Discharge status: Home / Self Care | Payer: 59 | Source: Ambulatory Visit | Attending: Family Medicine | Admitting: Family Medicine

## 2020-05-05 DIAGNOSIS — Z1231 Encounter for screening mammogram for malignant neoplasm of breast: Secondary | ICD-10-CM

## 2021-03-15 ENCOUNTER — Other Ambulatory Visit: Payer: Self-pay | Admitting: Physician Assistant

## 2021-03-15 DIAGNOSIS — Z1231 Encounter for screening mammogram for malignant neoplasm of breast: Secondary | ICD-10-CM

## 2021-05-08 ENCOUNTER — Ambulatory Visit
Admission: RE | Admit: 2021-05-08 | Discharge: 2021-05-08 | Disposition: A | Discharge status: Home / Self Care | Payer: 59 | Source: Ambulatory Visit | Attending: Physician Assistant | Admitting: Physician Assistant

## 2021-05-08 DIAGNOSIS — Z1231 Encounter for screening mammogram for malignant neoplasm of breast: Secondary | ICD-10-CM

## 2022-04-10 ENCOUNTER — Other Ambulatory Visit: Payer: Self-pay | Admitting: Physician Assistant

## 2022-04-10 DIAGNOSIS — Z1231 Encounter for screening mammogram for malignant neoplasm of breast: Secondary | ICD-10-CM

## 2022-05-24 ENCOUNTER — Ambulatory Visit: Payer: 59

## 2022-06-20 ENCOUNTER — Ambulatory Visit
Admission: RE | Admit: 2022-06-20 | Discharge: 2022-06-20 | Disposition: A | Discharge status: Home / Self Care | Payer: 59 | Source: Ambulatory Visit | Attending: Physician Assistant | Admitting: Physician Assistant

## 2022-06-20 DIAGNOSIS — Z1231 Encounter for screening mammogram for malignant neoplasm of breast: Secondary | ICD-10-CM

## 2022-11-27 ENCOUNTER — Emergency Department
Admission: EM | Admit: 2022-11-27 | Discharge: 2022-11-27 | Disposition: A | Discharge status: Home / Self Care | Payer: 59 | Attending: Emergency Medicine | Admitting: Emergency Medicine

## 2022-11-27 ENCOUNTER — Other Ambulatory Visit: Payer: Self-pay

## 2022-11-27 ENCOUNTER — Encounter: Payer: Self-pay | Admitting: Emergency Medicine

## 2022-11-27 DIAGNOSIS — Y92410 Unspecified street and highway as the place of occurrence of the external cause: Secondary | ICD-10-CM | POA: Insufficient documentation

## 2022-11-27 DIAGNOSIS — T148XXA Other injury of unspecified body region, initial encounter: Secondary | ICD-10-CM | POA: Insufficient documentation

## 2022-11-27 DIAGNOSIS — M7918 Myalgia, other site: Secondary | ICD-10-CM

## 2022-11-27 MED ORDER — NAPROXEN 500 MG PO TABS: 500.0000 mg | ORAL_TABLET | Freq: Two times a day (BID) | ORAL | 0 refills | Status: AC

## 2022-11-27 MED ORDER — CYCLOBENZAPRINE HCL 5 MG PO TABS: 5.0000 mg | ORAL_TABLET | Freq: Three times a day (TID) | ORAL | 0 refills | Status: DC | PRN
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
## 2023-10-09 ENCOUNTER — Other Ambulatory Visit: Payer: Self-pay | Admitting: Physician Assistant

## 2023-10-09 DIAGNOSIS — Z1231 Encounter for screening mammogram for malignant neoplasm of breast: Secondary | ICD-10-CM

## 2023-10-17 ENCOUNTER — Encounter

## 2023-10-19 ENCOUNTER — Other Ambulatory Visit: Payer: Self-pay

## 2023-10-19 ENCOUNTER — Emergency Department: Payer: MEDICAID

## 2023-10-19 ENCOUNTER — Emergency Department
Admission: EM | Admit: 2023-10-19 | Discharge: 2023-10-20 | Disposition: A | Discharge status: Home / Self Care | Payer: MEDICAID | Attending: Emergency Medicine | Admitting: Emergency Medicine

## 2023-10-19 DIAGNOSIS — R1900 Intra-abdominal and pelvic swelling, mass and lump, unspecified site: Secondary | ICD-10-CM | POA: Insufficient documentation

## 2023-10-19 DIAGNOSIS — R112 Nausea with vomiting, unspecified: Secondary | ICD-10-CM | POA: Insufficient documentation

## 2023-10-19 DIAGNOSIS — R1084 Generalized abdominal pain: Secondary | ICD-10-CM | POA: Insufficient documentation

## 2023-10-19 LAB — CBC
HCT: 35.1 % — ABNORMAL LOW (ref 36.0–46.0)
Hemoglobin: 11.7 g/dL — ABNORMAL LOW (ref 12.0–15.0)
MCH: 28.5 pg (ref 26.0–34.0)
MCHC: 33.3 g/dL (ref 30.0–36.0)
MCV: 85.6 fL (ref 80.0–100.0)
Platelets: 227 K/uL (ref 150–400)
RBC: 4.1 MIL/uL (ref 3.87–5.11)
RDW: 13.1 % (ref 11.5–15.5)
WBC: 7.2 K/uL (ref 4.0–10.5)
nRBC: 0 % (ref 0.0–0.2)

## 2023-10-19 LAB — COMPREHENSIVE METABOLIC PANEL WITH GFR
ALT: 9 U/L (ref 0–44)
AST: 19 U/L (ref 15–41)
Albumin: 3.9 g/dL (ref 3.5–5.0)
Alkaline Phosphatase: 101 U/L (ref 38–126)
Anion gap: 8 (ref 5–15)
BUN: 9 mg/dL (ref 6–20)
CO2: 26 mmol/L (ref 22–32)
Calcium: 9 mg/dL (ref 8.9–10.3)
Chloride: 105 mmol/L (ref 98–111)
Creatinine, Ser: 0.8 mg/dL (ref 0.44–1.00)
GFR, Estimated: >60 mL/min (ref >60)
Glucose, Bld: 165 mg/dL — ABNORMAL HIGH (ref 70–99)
Potassium: 3.3 mmol/L — ABNORMAL LOW (ref 3.5–5.1)
Sodium: 139 mmol/L (ref 135–145)
Total Bilirubin: 0.6 mg/dL (ref 0.0–1.2)
Total Protein: 8.4 g/dL — ABNORMAL HIGH (ref 6.5–8.1)

## 2023-10-19 LAB — URINALYSIS, ROUTINE W REFLEX MICROSCOPIC
Bacteria, UA: NONE SEEN
Bilirubin Urine: NEGATIVE
Glucose, UA: NEGATIVE mg/dL
Hgb urine dipstick: NEGATIVE
Ketones, ur: NEGATIVE mg/dL
Nitrite: NEGATIVE
Protein, ur: NEGATIVE mg/dL
Specific Gravity, Urine: 1.031 — ABNORMAL HIGH (ref 1.005–1.030)
pH: 8 (ref 5.0–8.0)

## 2023-10-19 LAB — LIPASE, BLOOD: Lipase: 15 U/L (ref 11–51)

## 2023-10-19 MED ORDER — FENTANYL CITRATE PF 50 MCG/ML IJ SOSY
50.0000 ug | PREFILLED_SYRINGE | Freq: Once | INTRAMUSCULAR | Status: AC
  Administered 2023-10-19: 50 ug via INTRAVENOUS
  Filled 2023-10-19: qty 1

## 2023-10-19 MED ORDER — ONDANSETRON 4 MG PO TBDP: 4.0000 mg | ORAL_TABLET | Freq: Three times a day (TID) | ORAL | 0 refills | Status: AC | PRN

## 2023-10-19 MED ORDER — SODIUM CHLORIDE 0.9 % IV SOLN
12.5000 mg | Freq: Four times a day (QID) | INTRAVENOUS | Status: DC | PRN
  Administered 2023-10-19: 12.5 mg via INTRAVENOUS
  Filled 2023-10-19: qty 12.5

## 2023-10-19 MED ORDER — IOHEXOL 300 MG/ML  SOLN
100.0000 mL | Freq: Once | INTRAMUSCULAR | Status: AC | PRN
  Administered 2023-10-19: 100 mL via INTRAVENOUS

## 2023-10-19 MED ORDER — HYDROCODONE-ACETAMINOPHEN 5-325 MG PO TABS: 1.0000 | ORAL_TABLET | Freq: Four times a day (QID) | ORAL | 0 refills | Status: AC | PRN

## 2023-10-19 MED ORDER — ONDANSETRON HCL 4 MG/2ML IJ SOLN
4.0000 mg | Freq: Once | INTRAMUSCULAR | Status: AC
  Administered 2023-10-19: 4 mg via INTRAVENOUS
  Filled 2023-10-19: qty 2

## 2023-10-19 MED ORDER — SODIUM CHLORIDE 0.9 % IV BOLUS
1000.0000 mL | Freq: Once | INTRAVENOUS | Status: AC
  Administered 2023-10-19: 1000 mL via INTRAVENOUS

## 2023-10-19 MED ORDER — MORPHINE SULFATE (PF) 4 MG/ML IV SOLN
4.0000 mg | Freq: Once | INTRAVENOUS | Status: AC
  Administered 2023-10-19: 4 mg via INTRAVENOUS
  Filled 2023-10-19 (×2): qty 1

## 2023-10-19 NOTE — ED Provider Notes (Signed)
 [facility] Provider Note    Event Date/Time   First MD Initiated Contact with Patient 10/19/23 1940     (approximate)   History   Abdominal Pain   HPI  Erin Singh is a 58 y.o. female with no significant past medical history and as listed in EMR presents to the emergency department for treatment and evaluation of abdominal pain with nausea and vomiting that started around 3 this afternoon after eating mozzarella sticks.  This has happened to her in the past.  Abdominal pain is diffuse.  She denies fever or diarrhea.    Physical Exam    Vitals:   10/19/23 1917 10/19/23 1918  BP:  124/74  Pulse: 67   Resp: 20   Temp: 97.6 F (36.4 C)   SpO2: 100%     General: Awake, no distress.  CV:  Good peripheral perfusion.  Resp:  Normal effort.  Abd:  No distention.  Diffuse tenderness to palpation Other:     ED Results / Procedures / Treatments   Labs (all labs ordered are listed, but only abnormal results are displayed)  Labs Reviewed  COMPREHENSIVE METABOLIC PANEL WITH GFR - Abnormal; Notable for the following components:      Result Value   Potassium 3.3 (*)    Glucose, Bld 165 (*)    Total Protein 8.4 (*)    All other components within normal limits  CBC - Abnormal; Notable for the following components:   Hemoglobin 11.7 (*)    HCT 35.1 (*)    All other components within normal limits  URINALYSIS, ROUTINE W REFLEX MICROSCOPIC - Abnormal; Notable for the following components:   Color, Urine COLORLESS (*)    APPearance CLEAR (*)    Specific Gravity, Urine 1.031 (*)    Leukocytes,Ua MODERATE (*)    All other components within normal limits  LIPASE, BLOOD     EKG  Not indicated   RADIOLOGY  Image and radiology report reviewed and interpreted by me. Radiology report consistent with the same.  CT abdomen and pelvis with contrast shows left pelvic round mass with coarse calcification measuring 6.3 x 6.0 x 5.8 cm of uncertain  origin.  Ultrasound shows heterogenous mass in the left posterior lower uterine segment/cervix measuring 5.1 cm likely an exophytic uterine fibroid.  PROCEDURES:  Critical Care performed: No  Procedures   MEDICATIONS ORDERED IN ED:  Medications  promethazine (PHENERGAN) 12.5 mg in sodium chloride 0.9 % 50 mL IVPB (12.5 mg Intravenous New Bag/Given 10/19/23 2345)  sodium chloride 0.9 % bolus 1,000 mL (0 mLs Intravenous Stopped 10/19/23 2345)  ondansetron (ZOFRAN) injection 4 mg (4 mg Intravenous Given 10/19/23 1957)  fentaNYL (SUBLIMAZE) injection 50 mcg (50 mcg Intravenous Given 10/19/23 2040)  iohexol (OMNIPAQUE) 300 MG/ML solution 100 mL (100 mLs Intravenous Contrast Given 10/19/23 2027)  morphine  (PF) 4 MG/ML injection 4 mg (4 mg Intravenous Given 10/19/23 2213)  fentaNYL (SUBLIMAZE) injection 50 mcg (50 mcg Intravenous Given 10/19/23 2340)     IMPRESSION / MDM / ASSESSMENT AND PLAN / ED COURSE   I have reviewed the triage note and vital signs. Vital signs stable   Differential diagnosis includes, but is not limited to, acute gastroenteritis, colitis, influenza, intra-abdominal infection, small bowel obstruction  Patient's presentation is most consistent with acute presentation with potential threat to life or bodily function.  58 year old female presenting to the emergency department for treatment and evaluation of nausea vomiting and abdominal pain after eating mozzarella sticks.  See HPI for further details.  On exam, abdominal pain is diffuse.  Plan will be to give her some IV fluids, nausea medication, pain medication, and get a CT abdomen pelvis with contrast..  Clinical Course as of 10/19/23 2358  Sun Oct 19, 2023  2105 Pelvic mass of unknown origin identified on CT of the abdomen and pelvis with contrast.  Radiologist recommends further evaluation with pelvic ultrasound.  Results discussed with the patient  and husband.  Ultrasound and additional pain medication ordered.  [CT]  2345 CBC is reassuring.  CMP shows a mild hypokalemia but otherwise normal.  Lipase is normal.  Urinalysis shows moderate leukocytes but without white blood cells, bacteria, or nitrates.  Pelvic ultrasound reveals Heterogeneous mass in the left posterior lower uterine segment/cervix measuring 5.1 cm, favored to represent an exophytic uterine fibroid.  Results discussed with the patient and husband.  Patient again feeling nauseated and is vomiting.  Phenergan ordered.  She is also requesting another dose of pain medication prior to discharge since the pharmacies are closed. She will be referred to OB/GYN for further evaluation of pelvic mass. [CT]  2357 Significant improvement after medications.  Patient feels ready for discharge. [CT]    Clinical Course User Index [CT] Danni Leabo B, FNP     FINAL CLINICAL IMPRESSION(S) / ED DIAGNOSES   Final diagnoses:  Generalized abdominal pain  Nausea and vomiting, unspecified vomiting type  Pelvic mass     Rx / DC Orders   ED Discharge Orders          Ordered    ondansetron (ZOFRAN-ODT) 4 MG disintegrating tablet  Every 8 hours PRN        10/19/23 2354    HYDROcodone-acetaminophen  (NORCO/VICODIN) 5-325 MG tablet  Every 6 hours PRN        10/19/23 2354             Note:  This document was prepared using Dragon voice recognition software and may include unintentional dictation errors.   Herlinda Kirk NOVAK, FNP 10/19/23 JERRLYN    Dicky Anes, MD 10/20/23 [phone]

## 2023-10-19 NOTE — ED Triage Notes (Signed)
 Pt reports abd pain with n/v that began today after eating mozz sticks. Pt reports similar episode in past after eating mozz sticks.

## 2023-10-19 NOTE — Discharge Instructions (Signed)
 Please call and schedule follow-up appointment with your primary care provider and gynecology.  While taking the pain medication, be aware that it may make you drowsy or dizzy and you should not drive or perform any other dangerous activities for at least 8 hours after your last dose.  Return to the emergency department if your symptoms change or worsen and you are unable to see primary care or the specialist.

## 2023-10-20 NOTE — ED Notes (Signed)
 PT in no acute distress prior to discharge. Discharged instructions reviewed, all questions answered and pt verbalized understanding at this time. Pt has all belongings with them at time of discharge.

## 2023-10-24 NOTE — Telephone Encounter (Signed)
 Referral placed. Thank you

## 2023-11-25 NOTE — Progress Notes (Signed)
 Computer technology was used to create visit note. Consent from the patient/caregiver was obtained prior to its use.  Patient ID:  Erin Singh is a 58 y.o. (DOB 1965/06/10) female.  Assessment and Plan   1. Epigastric pain  CBC And Differential   Comprehensive Metabolic Panel  2. Pelvic mass    3. Elevated alkaline phosphatase level  Gamma GT  4. Transaminitis  Acetaminophen  Level   Ethanol   Acute Viral Hepatitis (HAV, HBV, HCV)   Assessment & Plan 1. and 2. Abdominal pain: - Pain near the breastbone with slight breathing difficulty. - Tenderness in the abdomen RUQ without severe pain. - Patient has a known pelvis mass that has appeared suggestive of a fibroid on past imaging. There is low suspicion that this is contributing to today's pain. - Ordered CBC and CMP, results expected tomorrow. - Recommend bland diet with low-fat foods and OTC antacids like Prilosec or Nexium.  3. and 4. Elevated alkaline phosphatase and transaminitis. - The day after today's encounter, patient's CMP resulted with elevated alkaline phosphatase of 244, elevated AST at 120, and elevated ALT at 213. - Chart review shows that patient has not experienced transaminitis at least for the last 7 years, and patient does have a persistently elevated alkaline phosphatase for the last 3 years but the baseline is approximately 140. - Etiology unknown. This is a new problem with an uncertain prognosis. - Differential diagnosis: Cholecystitis, hepatitis, and acute liver injury. - GGT, acetaminophen  level, ethanol, and acute viral hepatitis labs added. See result notes for communication to patient after results are received. - Also ordered a right upper quadrant ultrasound for further evaluation.   Risks, benefits, and alternatives of the medications and treatment plan prescribed today were discussed, and patient expressed understanding.    Patient's Medications       * Accurate as of November 25, 2023  11:59 PM. Reflects encounter med changes as of last refresh          Discontinued Medications    cetirizine 10 mg chewable tablet Commonly known as: ZYRTEC,ALL DAY ALLERGY Stopped by: Yvonna Gamble, PA   cyclobenzaprine  5 mg tablet Commonly known as: FLEXERIL  Stopped by: Yvonna Gamble, PA   naproxen  500 mg tablet Commonly known as: NAPROSYN  Stopped by: Yvonna Gamble, PA   ondansetron  4 mg disintegrating tablet Commonly known as: ZOFRAN -ODT Stopped by: Yvonna Gamble, PA        Orders Placed This Encounter  Procedures  . CBC And Differential  . Comprehensive Metabolic Panel      Subjective   Patient ID:  Erin Singh is a 58 y.o. (DOB October 01, 1965) female    Patient presents with  . Abdominal Pain    Upper abdominal pain favoring right side     HPI:  History of Present Illness The patient, a 58 year old female, presents with abdominal pain.  Abdominal Pain - The pain, initially diffuse, has localized to the substernal region, with onset one week ago. - She reports mild dyspnea without severe pain. - The patient recalls experiencing severe generalized abdominal pain in late September or early October 2025, which she attributes to a fibroid, though no surgical intervention was required.  Thermoregulation and Nausea - She describes fluctuating thermoregulation. - Occasional nausea, but denies vomiting, diarrhea, or constipation.  Urine and Hydration - The patient observed dark urine indicative of dehydration, which improved with increased water intake.  Family History - She expresses concern about potential diabetes, given her family history.  Occupation: Works  in logistics at a company on [employer] Diet: Mashed potatoes, greens, frozen foods, applesauce, chicken tenders; avoids spicy foods  FAMILY HISTORY Diabetes runs in the family.   Reviewed and updated this visit by provider: Tobacco  Allergies  Meds  Problems  Med Hx   Surg Hx  Fam Hx        ROS: Review of Systems   Objective   Vitals: BP 116/76 (BP Location: Left Upper Arm, Patient Position: Sitting)   Pulse 87   Temp 97.2 F (36.2 C) (Temporal)   Ht 5' 4.5 (1.638 m)   Wt 160 lb 6.4 oz (72.8 kg)   SpO2 98%   BMI 27.11 kg/m   Physical Exam: Physical Exam  *Some images could not be shown.

## 2023-12-26 ENCOUNTER — Encounter (HOSPITAL_COMMUNITY): Payer: Self-pay

## 2023-12-26 ENCOUNTER — Emergency Department

## 2023-12-26 ENCOUNTER — Other Ambulatory Visit: Payer: Self-pay

## 2023-12-26 ENCOUNTER — Emergency Department: Admission: EM | Admit: 2023-12-26 | Discharge: 2023-12-27 | Disposition: A

## 2023-12-26 DIAGNOSIS — K8040 Calculus of bile duct with cholecystitis, unspecified, without obstruction: Secondary | ICD-10-CM | POA: Insufficient documentation

## 2023-12-26 DIAGNOSIS — A419 Sepsis, unspecified organism: Principal | ICD-10-CM

## 2023-12-26 DIAGNOSIS — K8050 Calculus of bile duct without cholangitis or cholecystitis without obstruction: Secondary | ICD-10-CM

## 2023-12-26 DIAGNOSIS — K8309 Other cholangitis: Secondary | ICD-10-CM

## 2023-12-26 DIAGNOSIS — K8030 Calculus of bile duct with cholangitis, unspecified, without obstruction: Secondary | ICD-10-CM | POA: Insufficient documentation

## 2023-12-26 DIAGNOSIS — R6521 Severe sepsis with septic shock: Secondary | ICD-10-CM | POA: Diagnosis not present

## 2023-12-26 DIAGNOSIS — K851 Biliary acute pancreatitis without necrosis or infection: Secondary | ICD-10-CM | POA: Insufficient documentation

## 2023-12-26 DIAGNOSIS — R579 Shock, unspecified: Secondary | ICD-10-CM

## 2023-12-26 DIAGNOSIS — K819 Cholecystitis, unspecified: Secondary | ICD-10-CM

## 2023-12-26 DIAGNOSIS — R7401 Elevation of levels of liver transaminase levels: Secondary | ICD-10-CM | POA: Insufficient documentation

## 2023-12-26 LAB — COMPREHENSIVE METABOLIC PANEL WITH GFR
ALT: 253 U/L — ABNORMAL HIGH (ref 0–44)
AST: 238 U/L — ABNORMAL HIGH (ref 15–41)
Albumin: 4.5 g/dL (ref 3.5–5.0)
Alkaline Phosphatase: 250 U/L — ABNORMAL HIGH (ref 38–126)
Anion gap: 16 — ABNORMAL HIGH (ref 5–15)
BUN: 12 mg/dL (ref 6–20)
CO2: 26 mmol/L (ref 22–32)
Calcium: 9.5 mg/dL (ref 8.9–10.3)
Chloride: 99 mmol/L (ref 98–111)
Creatinine, Ser: 0.97 mg/dL (ref 0.44–1.00)
GFR, Estimated: >60 mL/min (ref >60)
Glucose, Bld: 114 mg/dL — ABNORMAL HIGH (ref 70–99)
Potassium: 3.7 mmol/L (ref 3.5–5.1)
Sodium: 141 mmol/L (ref 135–145)
Total Bilirubin: 2.9 mg/dL — ABNORMAL HIGH (ref 0.0–1.2)
Total Protein: 8.3 g/dL — ABNORMAL HIGH (ref 6.5–8.1)

## 2023-12-26 LAB — CBC
HCT: 38 % (ref 36.0–46.0)
Hemoglobin: 12.5 g/dL (ref 12.0–15.0)
MCH: 28 pg (ref 26.0–34.0)
MCHC: 32.9 g/dL (ref 30.0–36.0)
MCV: 85 fL (ref 80.0–100.0)
Platelets: 228 K/uL (ref 150–400)
RBC: 4.47 MIL/uL (ref 3.87–5.11)
RDW: 13.3 % (ref 11.5–15.5)
WBC: 14.9 K/uL — ABNORMAL HIGH (ref 4.0–10.5)
nRBC: 0 % (ref 0.0–0.2)

## 2023-12-26 LAB — BILIRUBIN, DIRECT: Bilirubin, Direct: 2.1 mg/dL — ABNORMAL HIGH (ref 0.0–0.2)

## 2023-12-26 LAB — LIPASE, BLOOD: Lipase: 2212 U/L — ABNORMAL HIGH (ref 11–51)

## 2023-12-26 MED ORDER — SODIUM CHLORIDE 0.9 % IV BOLUS
1000.0000 mL | Freq: Once | INTRAVENOUS | Status: AC
  Administered 2023-12-26: 1000 mL via INTRAVENOUS

## 2023-12-26 MED ORDER — ONDANSETRON HCL 4 MG/2ML IJ SOLN
4.0000 mg | Freq: Once | INTRAMUSCULAR | Status: AC
  Administered 2023-12-26: 4 mg via INTRAVENOUS
  Filled 2023-12-26: qty 2

## 2023-12-26 MED ORDER — ONDANSETRON 4 MG PO TBDP
4.0000 mg | ORAL_TABLET | Freq: Once | ORAL | Status: AC | PRN
  Administered 2023-12-26: 4 mg via ORAL
  Filled 2023-12-26: qty 1

## 2023-12-26 MED ORDER — LACTATED RINGERS IV BOLUS: 500.0000 mL | Freq: Once | INTRAVENOUS | Status: DC

## 2023-12-26 MED ORDER — MORPHINE SULFATE (PF) 4 MG/ML IV SOLN
4.0000 mg | Freq: Once | INTRAVENOUS | Status: AC
  Administered 2023-12-26: 4 mg via INTRAVENOUS
  Filled 2023-12-26: qty 1

## 2023-12-26 MED ORDER — SODIUM CHLORIDE 0.9 % IV BOLUS
1000.0000 mL | Freq: Once | INTRAVENOUS | Status: AC
  Administered 2023-12-27: 1000 mL via INTRAVENOUS

## 2023-12-26 MED ORDER — LACTATED RINGERS IV BOLUS
1000.0000 mL | Freq: Once | INTRAVENOUS | Status: AC
  Administered 2023-12-26: 1000 mL via INTRAVENOUS

## 2023-12-26 MED ORDER — NOREPINEPHRINE 4 MG/250ML-% IV SOLN
0.0000 ug/min | INTRAVENOUS | Status: DC
  Administered 2023-12-26: 2 ug/min via INTRAVENOUS
  Administered 2023-12-27: 8 ug/min via INTRAVENOUS
  Filled 2023-12-26: qty 250

## 2023-12-26 MED ORDER — PIPERACILLIN-TAZOBACTAM 3.375 G IVPB 30 MIN
3.3750 g | Freq: Three times a day (TID) | INTRAVENOUS | Status: DC
  Filled 2023-12-26: qty 50

## 2023-12-26 MED ORDER — PANTOPRAZOLE SODIUM 40 MG IV SOLR
40.0000 mg | Freq: Once | INTRAVENOUS | Status: AC
  Administered 2023-12-26: 40 mg via INTRAVENOUS
  Filled 2023-12-26: qty 10

## 2023-12-26 MED ORDER — IOHEXOL 300 MG/ML  SOLN
100.0000 mL | Freq: Once | INTRAMUSCULAR | Status: AC | PRN
  Administered 2023-12-26: 100 mL via INTRAVENOUS

## 2023-12-26 MED ORDER — PIPERACILLIN-TAZOBACTAM 3.375 G IVPB 30 MIN
3.3750 g | Freq: Once | INTRAVENOUS | Status: AC
  Administered 2023-12-26: 3.375 g via INTRAVENOUS
  Filled 2023-12-26: qty 50

## 2023-12-26 NOTE — ED Notes (Signed)
 Called to [facility] Per MD Davis/Rep Crystal.

## 2023-12-26 NOTE — ED Provider Notes (Addendum)
 [facility] Provider Note    Event Date/Time   First MD Initiated Contact with Patient 12/26/23 1714     (approximate)   History   Abdominal Pain and Emesis   HPI  Erin Singh is a 58 y.o. female with no significant past medical history does not take medications regularly and no prior history of abdominal surgeries presents with 12 hours of epigastric and right upper quadrant abdominal pain with associated nausea.  She reports several episodes of vomiting that have been nonbloody.  Last bowel movement was this morning which was loose but not dark and without any blood.  Pain initiated after eating a salad yesterday and has been constant.  Denies any dysuria or hematuria.  She presents with her friend who contributes to the history      Physical Exam   Triage Vital Signs: ED Triage Vitals  Encounter Vitals Group     BP 12/26/23 1627 104/69     Girls Systolic BP Percentile --      Girls Diastolic BP Percentile --      Boys Systolic BP Percentile --      Boys Diastolic BP Percentile --      Pulse Rate 12/26/23 1626 (!) 108     Resp 12/26/23 1626 20     Temp 12/26/23 1626 98.7 F (37.1 C)     Temp Source 12/26/23 1626 Oral     SpO2 12/26/23 1626 91 %     Weight 12/26/23 1624 154 lb (69.9 kg)     Height 12/26/23 1624 5' 4 (1.626 m)     Head Circumference --      Peak Flow --      Pain Score 12/26/23 1625 9     Pain Loc --      Pain Education --      Exclude from Growth Chart --     Most recent vital signs: Vitals:   12/26/23 2315 12/26/23 2330  BP: (!) 79/50 (!) 77/52  Pulse: 85 88  Resp:    Temp:    SpO2:      Nursing Triage Note reviewed. Vital signs reviewed and patients oxygen saturation is normoxic  General: Patient is well nourished, well developed, awake and alert, appears unwell Head: Normocephalic and atraumatic Eyes: Normal inspection, extraocular muscles intact, no conjunctival pallor Ear, nose, throat: Normal external  exam Neck: Normal range of motion Respiratory: Patient is in no respiratory distress, lungs CTAB Cardiovascular: Patient is not tachycardic, RRR without murmur appreciated GI: Abd soft, tender to palpation in the epigastrium and right upper quadrant with no CVA tenderness to palpation Back: Normal inspection of the back with good strength and range of motion throughout all ext Extremities: pulses intact with good cap refills, no LE pitting edema or calf tenderness Neuro: The patient is alert and oriented to person, place, and time, appropriately conversive, with 5/5 bilat UE/LE strength, no gross motor or sensory defects noted. Coordination appears to be adequate. Skin: Warm, dry, and intact Psych: normal mood and affect, no SI or HI  ED Results / Procedures / Treatments   Labs (all labs ordered are listed, but only abnormal results are displayed) Labs Reviewed  LIPASE, BLOOD - Abnormal; Notable for the following components:      Result Value   Lipase 2,212 (*)    All other components within normal limits  COMPREHENSIVE METABOLIC PANEL WITH GFR - Abnormal; Notable for the following components:   Glucose, Bld 114 (*)  Total Protein 8.3 (*)    AST 238 (*)    ALT 253 (*)    Alkaline Phosphatase 250 (*)    Total Bilirubin 2.9 (*)    Anion gap 16 (*)    All other components within normal limits  CBC - Abnormal; Notable for the following components:   WBC 14.9 (*)    All other components within normal limits  BILIRUBIN, DIRECT - Abnormal; Notable for the following components:   Bilirubin, Direct 2.1 (*)    All other components within normal limits  CULTURE, BLOOD (ROUTINE X 2)  CULTURE, BLOOD (ROUTINE X 2)  URINALYSIS, ROUTINE W REFLEX MICROSCOPIC  LACTIC ACID, PLASMA  LACTIC ACID, PLASMA     EKG EKG and rhythm strip are interpreted by myself:   EKG: tachycardic sinus rhythm] at heart rate of 101, normal QRS duration, QTc 448, nonspecific ST segments and T waves no  ectopy EKG not consistent with Acute STEMI Rhythm strip: Tachycardic sinus rhythm in lead II   RADIOLOGY CT abdomen pelvis with IV contrast: Gallbladder thickening with biliary duct dilation and pancreatitis my independent review and interpretation radiologist agrees Right upper quadrant ultrasound   PROCEDURES:  Critical Care performed: Yes, see critical care procedure note(s)  .Critical Care  Performed by: Nicholaus Rolland BRAVO, MD Authorized by: Nicholaus Rolland BRAVO, MD   Critical care provider statement:    Critical care time (minutes):  75   Critical care time was exclusive of:  Separately billable procedures and treating other patients   Critical care was necessary to treat or prevent imminent or life-threatening deterioration of the following conditions:  Shock and sepsis   Critical care was time spent personally by me on the following activities:  Development of treatment plan with patient or surrogate, discussions with consultants, evaluation of patient's response to treatment, examination of patient, ordering and review of laboratory studies, ordering and review of radiographic studies, ordering and performing treatments and interventions, pulse oximetry, re-evaluation of patient's condition and review of old charts   Care discussed with: admitting provider   Comments:     Acute infection requiring aggressive fluid hydration, eventually Levophed , transfer requiring multiple phone calls with multiple consultants and frequent reassessments    MEDICATIONS ORDERED IN ED: Medications  piperacillin -tazobactam (ZOSYN ) IVPB 3.375 g (has no administration in time range)  norepinephrine  (LEVOPHED ) 4mg  in (0.016 mg/mL) premix infusion (2 mcg/min Intravenous New Bag/Given 12/26/23 2335)  sodium chloride  0.9 % bolus 1,000 mL (has no administration in time range)  ondansetron  (ZOFRAN -ODT) disintegrating tablet 4 mg (4 mg Oral Given 12/26/23 1630)  morphine  (PF) 4 MG/ML injection 4 mg (4 mg  Intravenous Given 12/26/23 1751)  sodium chloride  0.9 % bolus 1,000 mL (0 mLs Intravenous Stopped 12/26/23 2121)  ondansetron  (ZOFRAN ) injection 4 mg (4 mg Intravenous Given 12/26/23 1750)  pantoprazole  (PROTONIX ) injection 40 mg (40 mg Intravenous Given 12/26/23 1753)  iohexol  (OMNIPAQUE ) 300 MG/ML solution 100 mL (100 mLs Intravenous Contrast Given 12/26/23 1800)  piperacillin -tazobactam (ZOSYN ) IVPB 3.375 g (0 g Intravenous Stopped 12/26/23 2203)  morphine  (PF) 4 MG/ML injection 4 mg (4 mg Intravenous Given 12/26/23 2206)  ondansetron  (ZOFRAN ) injection 4 mg (4 mg Intravenous Given 12/26/23 2205)  lactated ringers  bolus 1,000 mL (1,000 mLs Intravenous New Bag/Given 12/26/23 2241)     IMPRESSION / MDM / ASSESSMENT AND PLAN / ED COURSE  Differential diagnosis includes, but is not limited to, pancreatitis, GERD, diverticulitis, small bowel obstruction, cholecystitis, biliary colic   ED course: Patient presents and appears unwell and she is tachycardic.  IV inserted and she was given morphine  IV fluids and Zofran .  I did decide to initially start with a CT abdomen pelvis given that pain seems more epigastric in nature and this did return with pancreatitis and concern for acute cholecystitis.  Lipase was elevated along with her total bilirubin and LFTs.  She does have a leukocytosis.  Zosyn  was administered and a right upper quadrant ultrasound is pending.  Patient counseled on the results that we have thus far.  Anticipate admission and likely surgical intervention   Clinical Course as of 12/26/23 2355  [facility] Dec 26, 2023  1807 Lipase(!): 2,212 Very elevated [HD]  1822 CT ABDOMEN PELVIS W CONTRAST Consistent with cholecystitis.  Patient getting Zosyn  and IV fluids.  Right upper quadrant ultrasound pending [HD]  1822 AST(!): 238 [HD]  1822 ALT(!): 253 [HD]  1822 Total Bilirubin(!): 2.9 [HD]  1903 US  ABDOMEN LIMITED RUQ (LIVER/GB) Consistent with cholecystitis will  page general surgery [HD]  1955 I touch base with Dr. Desiderio, and reviewed the concerns.  He requests an MRCP which I have ordered.  Patient updated [HD]  2135 There was a stone passed the common biliary duct.  Dr. Desiderio states that patient needs transfer for ERCP.  I discussed the case with the patient and she voiced understanding.  She has Zosyn  scheduled around-the-clock.  Patient does have a strong preference to go to [facility] as her daughter is doing a scientist, water quality there.  Discussed case with transfer center [HD]  2215 [facility] acted in case discussed with gastroenterologist Dr. Natalie Wilson.  She agrees that patient needs an emergent ERCP however they do not have anyone on this weekend who can do this procedure.  Will next reach out to [facility] [HD]  2254 Mansourtay from GI states that they have capability to do this procedure at [facility] however patient needs to be admitted to the medicine team.  Transfer center reaching out to medicine team [HD]  2317 Was talking to Dr. Charlton of hospitalist medicine when noticed patient's blood pressure was not improving despite getting the second bolus of IV fluids.  Patient's last recorded blood pressure when I was in the room and patient was awake with 70/60.  Will start no epi and talk to [facility] ICU [HD]  2335 Case discussed with Dr. Rolan Sharps and patient accepted for admission to the ICU at [facility].  Patient updated.  Levophed  running.  Awaiting transfer [HD]  2355 Patient signed out to oncoming physician at 11:50 PM [HD]    Clinical Course User Index [HD] Nicholaus Rolland BRAVO, MD   -- Risk: 5 This patient has a high risk of morbidity due to further diagnostic testing or treatment. Rationale: This patient's evaluation and management involve a high risk of morbidity due to the potential severity of presenting symptoms, need for diagnostic testing, and/or initiation of treatment that may require close monitoring. The differential includes  conditions with potential for significant deterioration or requiring escalation of care. Treatment decisions in the ED, including medication administration, procedural interventions, or disposition planning, reflect this level of risk. COPA: 5 The patient has the following acute or chronic illness/injury that poses a possible threat to life or bodily function: [X] : The patient has a potentially serious acute condition or an acute exacerbation of a chronic  illness requiring urgent evaluation and management in the Emergency Department. The clinical presentation necessitates immediate consideration of life-threatening or function-threatening diagnoses, even if they are ultimately ruled out.   FINAL CLINICAL IMPRESSION(S) / ED DIAGNOSES   Final diagnoses:  Cholangitis (HCC)  Choledocholithiasis  Acute gallstone pancreatitis  Cholecystitis  Shock (HCC)     Rx / DC Orders   ED Discharge Orders     None        Note:  This document was prepared using Dragon voice recognition software and may include unintentional dictation errors.   Nicholaus Rolland BRAVO, MD 12/26/23 7663    Nicholaus Rolland BRAVO, MD 12/26/23 [phone]

## 2023-12-26 NOTE — Progress Notes (Signed)
 12/26/2023 Accepted to [facility] for septic shock 2/2 ascending cholangitis from obstructing stone, seems to have been having symptomatic choledocholithiasis for past month looking at PCP note.  EDP has contacted Dr. Wilhelmenia who confirms he is available to perform ERCP as needed.  Levophed  fine, can also give more fluids Check and monitor lactate or cap refill as surrogate for resuscitation NPO Trend labs Empiric zosyn   Will see on arrival.  Rolan Sharps MD PCCM

## 2023-12-26 NOTE — ED Notes (Signed)
 Called to [facility] Per MD Davis/Face sheet Faxed & Images Power shared/Rep Jenkins.

## 2023-12-26 NOTE — H&P (Incomplete)
 NAME:  Erin Singh, MRN:  [id number], DOB:  05-27-1965, LOS: 0 ADMISSION DATE:  (Not on file), CONSULTATION DATE:  12/26/23 REFERRING MD:  EDP, CHIEF COMPLAINT:  septic shock    History of Present Illness:  Erin Singh 58 year old female presented to [facility] emergency department with 1 day of abdominal pain nausea and vomiting.  No noted fevers, contacts with similar symptoms.  No hematemesis.  Workup was significant for lipase of 2200 AST 238, ALT 253 total bili 2.9, WBCs 14.9.  CT abdomen/pelvis with acute pancreatitis and gallbladder wall thickening with biliary ductal dilation.  Right upper quadrant ultrasound showed cholelithiasis with marked gallbladder wall thickening consistent with acute cholecystitis.  MRCP also showed cholelithiasis with 8 mm CBD calculus GI and patient was recommended for and emergent ERCP, therefore was transferred to [facility] for this procedure.  During ED course patient's blood pressure began dropping, received 2 L IV fluid, Zosyn  and started on norepinephrine  and PCCM was consulted for admission  Pertinent  Medical History   has a past medical history of Fatigue and Wears glasses.   Significant Hospital Events: Including procedures, antibiotic start and stop dates in addition to other pertinent events   12/5 presented to [facility] ED, admitted to [facility] for ERCP  Interim History / Subjective:  ***  Objective    Last menstrual period 01/31/2014.        Intake/Output Summary (Last 24 hours) at 12/26/2023 2339 Last data filed at 12/26/2023 2121 Gross per 24 hour  Intake 1000 ml  Output --  Net 1000 ml   There were no vitals filed for this visit.  General:   HEENT: MM pink/moist Neuro:  CV: s1s2 ***, no m/r/g PULM:  *** GI: soft, bsx4 active  Extremities: warm/dry, *** edema  Skin: no rashes or lesions  Resolved problem list   Assessment and Plan   Septic shock due to acute cholecystitis  Pancreatitis  Labs   CBC: Recent Labs  Lab  12/26/23 1624  WBC 14.9*  HGB 12.5  HCT 38.0  MCV 85.0  PLT 228    Basic Metabolic Panel: Recent Labs  Lab 12/26/23 1624  NA 141  K 3.7  CL 99  CO2 26  GLUCOSE 114*  BUN 12  CREATININE 0.97  CALCIUM 9.5   GFR: Estimated Creatinine Clearance: 62.2 mL/min (by C-G formula based on SCr of 0.97 mg/dL). Recent Labs  Lab 12/26/23 1624  WBC 14.9*    Liver Function Tests: Recent Labs  Lab 12/26/23 1624  AST 238*  ALT 253*  ALKPHOS 250*  BILITOT 2.9*  PROT 8.3*  ALBUMIN 4.5   Recent Labs  Lab 12/26/23 1624  LIPASE 2,212*   No results for input(s): AMMONIA in the last 168 hours.  ABG    Component Value Date/Time   TCO2 23 06/16/2009 0928     Coagulation Profile: No results for input(s): INR, PROTIME in the last 168 hours.  Cardiac Enzymes: No results for input(s): CKTOTAL, CKMB, CKMBINDEX, TROPONINI in the last 168 hours.  HbA1C: No results found for: HGBA1C  CBG: No results for input(s): GLUCAP in the last 168 hours.  Review of Systems:   ***  Past Medical History:  She,  has a past medical history of Fatigue and Wears glasses.   Surgical History:  No past surgical history on file.   Social History:   reports that she has never smoked. She has never used smokeless tobacco. She reports current alcohol use. She reports that  she does not use drugs.   Family History:  Her family history includes Breast cancer in her maternal aunt; Diabetes in her mother; Kidney failure in her mother; Other in her mother.   Allergies No Known Allergies   Home Medications  Prior to Admission medications   Medication Sig Start Date End Date Taking? Authorizing Provider  cyclobenzaprine  (FLEXERIL ) 5 MG tablet Take 1 tablet (5 mg total) by mouth 3 (three) times daily as needed. 11/27/22   Menshew, Candida LULLA Kings, PA-C  ondansetron  (ZOFRAN -ODT) 4 MG disintegrating tablet Take 1 tablet (4 mg total) by mouth every 8 (eight) hours as needed for nausea  or vomiting. 10/19/23   Herlinda Kirk NOVAK, FNP     Critical care time: ***

## 2023-12-26 NOTE — ED Notes (Signed)
 Patient to MRI.

## 2023-12-26 NOTE — ED Triage Notes (Signed)
 Pt to ED for dull upper mid abdominal pain, nausea and vomiting since last night. Pt vomiting profusely in triage. States 4 episodes since last night.

## 2023-12-26 NOTE — H&P (Signed)
 NAME:  ISYSS ESPINAL, MRN:  [id number], DOB:  08-02-1965, LOS: 0 ADMISSION DATE:  (Not on file), CONSULTATION DATE:  12/26/23 REFERRING MD:  EDP, CHIEF COMPLAINT:  septic shock    History of Present Illness:  Ashlon Lottman 58 year old female presented to [facility] emergency department with 1 day of abdominal pain nausea and vomiting.  No noted fevers, contacts with similar symptoms.  No hematemesis.  Workup was significant for lipase of 2200 AST 238, ALT 253 total bili 2.9, WBCs 14.9.  CT abdomen/pelvis with acute pancreatitis and gallbladder wall thickening with biliary ductal dilation.  Right upper quadrant ultrasound showed cholelithiasis with marked gallbladder wall thickening consistent with acute cholecystitis.  MRCP also showed cholelithiasis with 8 mm CBD calculus GI and patient was recommended for and emergent ERCP, therefore was transferred to [facility] for this procedure.  During ED course patient's blood pressure began dropping, received 2 L IV fluid, Zosyn  and started on norepinephrine  and PCCM was consulted for admission  Arrives to [facility], pain is a bit better, she is anxious. Nausea controlled.  Has been having GB symptoms for a few months trying to modify diet.  Pertinent  Medical History   has a past medical history of Fatigue and Wears glasses.   Significant Hospital Events: Including procedures, antibiotic start and stop dates in addition to other pertinent events   12/5 presented to [facility] ED, admitted to [facility] for ERCP  Interim History / Subjective:  Admit  Objective    Last menstrual period 01/31/2014.        Intake/Output Summary (Last 24 hours) at 12/26/2023 2339 Last data filed at 12/26/2023 2121 Gross per 24 hour  Intake 1000 ml  Output --  Net 1000 ml   There were no vitals filed for this visit.  General:   MAP 80s, levo single digits, RR 18, Pulse 80s, sats 99 RA  Jaundice Minimal TTP RUQ Moves to command Aox3 +sceral icterus No edema Ext  warm  Labs/imaging reviewed   Resolved problem list   Assessment and Plan   Septic shock due to ascending cholangitis Gallstone Pancreatitis - IVF, zosyn , NPO - Pain/anxiety control as ordered - Will let GI know she's here (Dr. Wilhelmenia) - CCS consult in AM  Labs   CBC: Recent Labs  Lab 12/26/23 1624  WBC 14.9*  HGB 12.5  HCT 38.0  MCV 85.0  PLT 228    Basic Metabolic Panel: Recent Labs  Lab 12/26/23 1624  NA 141  K 3.7  CL 99  CO2 26  GLUCOSE 114*  BUN 12  CREATININE 0.97  CALCIUM 9.5   GFR: Estimated Creatinine Clearance: 62.2 mL/min (by C-G formula based on SCr of 0.97 mg/dL). Recent Labs  Lab 12/26/23 1624  WBC 14.9*    Liver Function Tests: Recent Labs  Lab 12/26/23 1624  AST 238*  ALT 253*  ALKPHOS 250*  BILITOT 2.9*  PROT 8.3*  ALBUMIN 4.5   Recent Labs  Lab 12/26/23 1624  LIPASE 2,212*   No results for input(s): AMMONIA in the last 168 hours.  ABG    Component Value Date/Time   TCO2 23 06/16/2009 0928     Coagulation Profile: No results for input(s): INR, PROTIME in the last 168 hours.  Cardiac Enzymes: No results for input(s): CKTOTAL, CKMB, CKMBINDEX, TROPONINI in the last 168 hours.  HbA1C: No results found for: HGBA1C  CBG: No results for input(s): GLUCAP in the last 168 hours.  Review of Systems:   Please  see the history of present illness. All other systems reviewed and are negative    Past Medical History:  She,  has a past medical history of Fatigue and Wears glasses.   Surgical History:  No past surgical history on file.   Social History:   reports that she has never smoked. She has never used smokeless tobacco. She reports current alcohol use. She reports that she does not use drugs.   Family History:  Her family history includes Breast cancer in her maternal aunt; Diabetes in her mother; Kidney failure in her mother; Other in her mother.   Allergies No Known Allergies   Home  Medications  Prior to Admission medications   Medication Sig Start Date End Date Taking? Authorizing Provider  cyclobenzaprine  (FLEXERIL ) 5 MG tablet Take 1 tablet (5 mg total) by mouth 3 (three) times daily as needed. 11/27/22   Menshew, Candida LULLA Kings, PA-C  ondansetron  (ZOFRAN -ODT) 4 MG disintegrating tablet Take 1 tablet (4 mg total) by mouth every 8 (eight) hours as needed for nausea or vomiting. 10/19/23   Herlinda Belton B, FNP     Critical care time: 31 min cc time

## 2023-12-27 ENCOUNTER — Inpatient Hospital Stay (HOSPITAL_COMMUNITY): Admitting: Anesthesiology

## 2023-12-27 ENCOUNTER — Encounter (HOSPITAL_COMMUNITY)
Admission: EM | Disposition: A | Discharge status: Home / Self Care | Payer: Self-pay | Source: Other Acute Inpatient Hospital

## 2023-12-27 ENCOUNTER — Inpatient Hospital Stay (HOSPITAL_COMMUNITY)
Admit: 2023-12-27 | Discharge: 2023-12-30 | DRG: 853 | Disposition: A | Attending: Internal Medicine | Admitting: Internal Medicine

## 2023-12-27 ENCOUNTER — Inpatient Hospital Stay (HOSPITAL_COMMUNITY)

## 2023-12-27 ENCOUNTER — Encounter (HOSPITAL_COMMUNITY): Payer: Self-pay | Admitting: Internal Medicine

## 2023-12-27 DIAGNOSIS — K2970 Gastritis, unspecified, without bleeding: Secondary | ICD-10-CM | POA: Diagnosis present

## 2023-12-27 DIAGNOSIS — K838 Other specified diseases of biliary tract: Secondary | ICD-10-CM

## 2023-12-27 DIAGNOSIS — K8050 Calculus of bile duct without cholangitis or cholecystitis without obstruction: Secondary | ICD-10-CM

## 2023-12-27 DIAGNOSIS — R6521 Severe sepsis with septic shock: Secondary | ICD-10-CM | POA: Diagnosis present

## 2023-12-27 DIAGNOSIS — K851 Biliary acute pancreatitis without necrosis or infection: Secondary | ICD-10-CM | POA: Diagnosis present

## 2023-12-27 DIAGNOSIS — K8020 Calculus of gallbladder without cholecystitis without obstruction: Secondary | ICD-10-CM

## 2023-12-27 DIAGNOSIS — Z8419 Family history of other disorders of kidney and ureter: Secondary | ICD-10-CM | POA: Diagnosis not present

## 2023-12-27 DIAGNOSIS — K2950 Unspecified chronic gastritis without bleeding: Secondary | ICD-10-CM

## 2023-12-27 DIAGNOSIS — R1111 Vomiting without nausea: Secondary | ICD-10-CM | POA: Diagnosis not present

## 2023-12-27 DIAGNOSIS — Z803 Family history of malignant neoplasm of breast: Secondary | ICD-10-CM | POA: Diagnosis not present

## 2023-12-27 DIAGNOSIS — K8309 Other cholangitis: Secondary | ICD-10-CM | POA: Diagnosis present

## 2023-12-27 DIAGNOSIS — K8030 Calculus of bile duct with cholangitis, unspecified, without obstruction: Secondary | ICD-10-CM | POA: Diagnosis not present

## 2023-12-27 DIAGNOSIS — K3189 Other diseases of stomach and duodenum: Secondary | ICD-10-CM | POA: Diagnosis present

## 2023-12-27 DIAGNOSIS — R5383 Other fatigue: Secondary | ICD-10-CM | POA: Diagnosis present

## 2023-12-27 DIAGNOSIS — R748 Abnormal levels of other serum enzymes: Secondary | ICD-10-CM

## 2023-12-27 DIAGNOSIS — Z743 Need for continuous supervision: Secondary | ICD-10-CM | POA: Diagnosis not present

## 2023-12-27 DIAGNOSIS — K449 Diaphragmatic hernia without obstruction or gangrene: Secondary | ICD-10-CM | POA: Diagnosis present

## 2023-12-27 DIAGNOSIS — K810 Acute cholecystitis: Secondary | ICD-10-CM

## 2023-12-27 DIAGNOSIS — R1084 Generalized abdominal pain: Secondary | ICD-10-CM | POA: Diagnosis not present

## 2023-12-27 DIAGNOSIS — R110 Nausea: Secondary | ICD-10-CM | POA: Diagnosis not present

## 2023-12-27 DIAGNOSIS — Z635 Disruption of family by separation and divorce: Secondary | ICD-10-CM | POA: Diagnosis not present

## 2023-12-27 DIAGNOSIS — A419 Sepsis, unspecified organism: Secondary | ICD-10-CM | POA: Diagnosis present

## 2023-12-27 DIAGNOSIS — K8066 Calculus of gallbladder and bile duct with acute and chronic cholecystitis without obstruction: Secondary | ICD-10-CM | POA: Diagnosis present

## 2023-12-27 DIAGNOSIS — Z833 Family history of diabetes mellitus: Secondary | ICD-10-CM | POA: Diagnosis not present

## 2023-12-27 DIAGNOSIS — Z91030 Bee allergy status: Secondary | ICD-10-CM | POA: Diagnosis not present

## 2023-12-27 HISTORY — PX: ERCP: SHX5425

## 2023-12-27 HISTORY — PX: ERCP: SHX60

## 2023-12-27 LAB — BASIC METABOLIC PANEL WITH GFR
Anion gap: 9 (ref 5–15)
BUN: 13 mg/dL (ref 6–20)
CO2: 26 mmol/L (ref 22–32)
Calcium: 8.2 mg/dL — ABNORMAL LOW (ref 8.9–10.3)
Chloride: 103 mmol/L (ref 98–111)
Creatinine, Ser: 1.11 mg/dL — ABNORMAL HIGH (ref 0.44–1.00)
GFR, Estimated: 58 mL/min — ABNORMAL LOW (ref >60)
Glucose, Bld: 109 mg/dL — ABNORMAL HIGH (ref 70–99)
Potassium: 3.3 mmol/L — ABNORMAL LOW (ref 3.5–5.1)
Sodium: 138 mmol/L (ref 135–145)

## 2023-12-27 LAB — HEPATIC FUNCTION PANEL
ALT: 163 U/L — ABNORMAL HIGH (ref 0–44)
AST: 122 U/L — ABNORMAL HIGH (ref 15–41)
Albumin: 3.1 g/dL — ABNORMAL LOW (ref 3.5–5.0)
Alkaline Phosphatase: 162 U/L — ABNORMAL HIGH (ref 38–126)
Bilirubin, Direct: 1 mg/dL — ABNORMAL HIGH (ref 0.0–0.2)
Indirect Bilirubin: 1.2 mg/dL — ABNORMAL HIGH (ref 0.3–0.9)
Total Bilirubin: 2.2 mg/dL — ABNORMAL HIGH (ref 0.0–1.2)
Total Protein: 7 g/dL (ref 6.5–8.1)

## 2023-12-27 LAB — GLUCOSE, CAPILLARY: Glucose-Capillary: 100 mg/dL — ABNORMAL HIGH (ref 70–99)

## 2023-12-27 LAB — CBC
HCT: 33.9 % — ABNORMAL LOW (ref 36.0–46.0)
Hemoglobin: 11.2 g/dL — ABNORMAL LOW (ref 12.0–15.0)
MCH: 28.1 pg (ref 26.0–34.0)
MCHC: 33 g/dL (ref 30.0–36.0)
MCV: 85.2 fL (ref 80.0–100.0)
Platelets: 219 K/uL (ref 150–400)
RBC: 3.98 MIL/uL (ref 3.87–5.11)
RDW: 13.5 % (ref 11.5–15.5)
WBC: 18.3 K/uL — ABNORMAL HIGH (ref 4.0–10.5)
nRBC: 0 % (ref 0.0–0.2)

## 2023-12-27 LAB — TYPE AND SCREEN
ABO/RH(D): A POS
Antibody Screen: NEGATIVE

## 2023-12-27 LAB — SEDIMENTATION RATE: Sed Rate: 43 mm/h — ABNORMAL HIGH (ref 0–22)

## 2023-12-27 LAB — SURGICAL PCR SCREEN
MRSA, PCR: NEGATIVE
Staphylococcus aureus: NEGATIVE

## 2023-12-27 LAB — ABO/RH: ABO/RH(D): A POS

## 2023-12-27 LAB — PROTIME-INR
INR: 1.3 — ABNORMAL HIGH (ref 0.8–1.2)
Prothrombin Time: 17 s — ABNORMAL HIGH (ref 11.4–15.2)

## 2023-12-27 LAB — LIPASE, BLOOD: Lipase: 365 U/L — ABNORMAL HIGH (ref 11–51)

## 2023-12-27 LAB — HIV ANTIBODY (ROUTINE TESTING W REFLEX): HIV Screen 4th Generation wRfx: NONREACTIVE

## 2023-12-27 LAB — PHOSPHORUS: Phosphorus: 4.2 mg/dL (ref 2.5–4.6)

## 2023-12-27 LAB — MAGNESIUM: Magnesium: 1.8 mg/dL (ref 1.7–2.4)

## 2023-12-27 LAB — LACTIC ACID, PLASMA: Lactic Acid, Venous: 1.6 mmol/L (ref 0.5–1.9)

## 2023-12-27 LAB — C-REACTIVE PROTEIN: CRP: 6.5 mg/dL — ABNORMAL HIGH (ref <1.0)

## 2023-12-27 SURGERY — ERCP, WITH INTERVENTION IF INDICATED
Anesthesia: General

## 2023-12-27 MED ORDER — SODIUM CHLORIDE 0.9 % IV SOLN
INTRAVENOUS | Status: DC | PRN
  Administered 2023-12-27: 25 mL

## 2023-12-27 MED ORDER — FENTANYL CITRATE (PF) 100 MCG/2ML IJ SOLN: 25.0000 ug | INTRAMUSCULAR | Status: DC | PRN

## 2023-12-27 MED ORDER — OXYCODONE HCL 5 MG/5ML PO SOLN: 5.0000 mg | Freq: Once | ORAL | Status: DC | PRN

## 2023-12-27 MED ORDER — OXYCODONE HCL 5 MG PO TABS: 5.0000 mg | ORAL_TABLET | Freq: Once | ORAL | Status: DC | PRN

## 2023-12-27 MED ORDER — GLUCAGON HCL RDNA (DIAGNOSTIC) 1 MG IJ SOLR
INTRAMUSCULAR | Status: DC | PRN
  Administered 2023-12-27 (×2): .25 mg via INTRAVENOUS

## 2023-12-27 MED ORDER — CHLORHEXIDINE GLUCONATE CLOTH 2 % EX PADS: 6.0000 | MEDICATED_PAD | Freq: Every day | CUTANEOUS | Status: DC

## 2023-12-27 MED ORDER — GLUCAGON HCL RDNA (DIAGNOSTIC) 1 MG IJ SOLR
INTRAMUSCULAR | Status: AC
  Filled 2023-12-27: qty 2

## 2023-12-27 MED ORDER — SODIUM CHLORIDE 0.9 % IV SOLN
250.0000 mL | INTRAVENOUS | Status: AC
  Administered 2023-12-27: 250 mL via INTRAVENOUS

## 2023-12-27 MED ORDER — INDOCYANINE GREEN 25 MG IJ SOLR: 2.5000 mg | INTRAMUSCULAR | Status: DC

## 2023-12-27 MED ORDER — SODIUM CHLORIDE 0.9 % IV SOLN: INTRAVENOUS | Status: DC

## 2023-12-27 MED ORDER — POTASSIUM CHLORIDE 10 MEQ/100ML IV SOLN
10.0000 meq | INTRAVENOUS | Status: AC
  Administered 2023-12-27 (×3): 10 meq via INTRAVENOUS

## 2023-12-27 MED ORDER — LACTATED RINGERS IV BOLUS
1000.0000 mL | Freq: Once | INTRAVENOUS | Status: AC
  Administered 2023-12-27: 1000 mL via INTRAVENOUS

## 2023-12-27 MED ORDER — CIPROFLOXACIN IN D5W 400 MG/200ML IV SOLN
INTRAVENOUS | Status: AC
  Filled 2023-12-27: qty 200

## 2023-12-27 MED ORDER — HYDROMORPHONE HCL 1 MG/ML IJ SOLN
0.5000 mg | INTRAMUSCULAR | Status: DC | PRN
  Administered 2023-12-28: 0.5 mg via INTRAVENOUS
  Filled 2023-12-27: qty 1

## 2023-12-27 MED ORDER — DOCUSATE SODIUM 100 MG PO CAPS: 100.0000 mg | ORAL_CAPSULE | Freq: Two times a day (BID) | ORAL | Status: DC | PRN

## 2023-12-27 MED ORDER — PIPERACILLIN-TAZOBACTAM 3.375 G IVPB
3.3750 g | Freq: Three times a day (TID) | INTRAVENOUS | Status: DC
  Administered 2023-12-27 – 2023-12-28 (×4): 3.375 g via INTRAVENOUS
  Filled 2023-12-27 (×4): qty 50

## 2023-12-27 MED ORDER — NOREPINEPHRINE 4 MG/250ML-% IV SOLN
0.0000 ug/min | INTRAVENOUS | Status: DC
  Administered 2023-12-27: 5 ug/min via INTRAVENOUS
  Administered 2023-12-27: 4 ug/min via INTRAVENOUS
  Filled 2023-12-27: qty 250

## 2023-12-27 MED ORDER — DICLOFENAC SUPPOSITORY 100 MG
RECTAL | Status: DC | PRN
  Administered 2023-12-27: 100 mg via RECTAL

## 2023-12-27 MED ORDER — CHLORHEXIDINE GLUCONATE CLOTH 2 % EX PADS
6.0000 | MEDICATED_PAD | Freq: Every day | CUTANEOUS | Status: DC
  Administered 2023-12-27 – 2023-12-29 (×3): 6 via TOPICAL

## 2023-12-27 MED ORDER — FENTANYL CITRATE (PF) 100 MCG/2ML IJ SOLN
INTRAMUSCULAR | Status: DC | PRN
  Administered 2023-12-27: 100 ug via INTRAVENOUS

## 2023-12-27 MED ORDER — AMISULPRIDE (ANTIEMETIC) 5 MG/2ML IV SOLN: 10.0000 mg | Freq: Once | INTRAVENOUS | Status: DC | PRN

## 2023-12-27 MED ORDER — LIDOCAINE 2% (20 MG/ML) 5 ML SYRINGE
INTRAMUSCULAR | Status: DC | PRN
  Administered 2023-12-27: 60 mg via INTRAVENOUS

## 2023-12-27 MED ORDER — ONDANSETRON HCL 4 MG/2ML IJ SOLN: 4.0000 mg | Freq: Four times a day (QID) | INTRAMUSCULAR | Status: DC | PRN

## 2023-12-27 MED ORDER — ONDANSETRON HCL 4 MG/2ML IJ SOLN
INTRAMUSCULAR | Status: DC | PRN
  Administered 2023-12-27: 4 mg via INTRAVENOUS

## 2023-12-27 MED ORDER — LACTATED RINGERS IV SOLN: INTRAVENOUS | Status: AC

## 2023-12-27 MED ORDER — LACTATED RINGERS IV SOLN: INTRAVENOUS | Status: DC

## 2023-12-27 MED ORDER — INDOMETHACIN 50 MG RE SUPP
RECTAL | Status: AC
  Filled 2023-12-27: qty 2

## 2023-12-27 MED ORDER — SUGAMMADEX SODIUM 200 MG/2ML IV SOLN
INTRAVENOUS | Status: DC | PRN
  Administered 2023-12-27: 200 mg via INTRAVENOUS

## 2023-12-27 MED ORDER — DICLOFENAC SUPPOSITORY 100 MG
RECTAL | Status: AC
  Filled 2023-12-27: qty 1

## 2023-12-27 MED ORDER — MUPIROCIN 2 % EX OINT
1.0000 | TOPICAL_OINTMENT | Freq: Two times a day (BID) | CUTANEOUS | Status: DC
  Administered 2023-12-27 – 2023-12-29 (×5): 1 via NASAL
  Filled 2023-12-27 (×2): qty 22

## 2023-12-27 MED ORDER — INDOMETHACIN 50 MG RE SUPP: 100.0000 mg | Freq: Once | RECTAL | Status: DC

## 2023-12-27 MED ORDER — PROPOFOL 10 MG/ML IV BOLUS
INTRAVENOUS | Status: DC | PRN
  Administered 2023-12-27: 100 mg via INTRAVENOUS

## 2023-12-27 MED ORDER — HYDROMORPHONE HCL 1 MG/ML IJ SOLN: 0.2500 mg | INTRAMUSCULAR | Status: DC | PRN

## 2023-12-27 MED ORDER — FENTANYL CITRATE (PF) 100 MCG/2ML IJ SOLN
INTRAMUSCULAR | Status: AC
  Filled 2023-12-27: qty 2

## 2023-12-27 MED ORDER — POTASSIUM CHLORIDE 10 MEQ/100ML IV SOLN
10.0000 meq | INTRAVENOUS | Status: AC
  Administered 2023-12-27 (×3): 10 meq via INTRAVENOUS
  Filled 2023-12-27 (×6): qty 100

## 2023-12-27 MED ORDER — DEXAMETHASONE SOD PHOSPHATE PF 10 MG/ML IJ SOLN
INTRAMUSCULAR | Status: DC | PRN
  Administered 2023-12-27: 10 mg via INTRAVENOUS

## 2023-12-27 MED ORDER — ACETAMINOPHEN 10 MG/ML IV SOLN: 1000.0000 mg | Freq: Once | INTRAVENOUS | Status: DC | PRN

## 2023-12-27 MED ORDER — TRAZODONE HCL 50 MG PO TABS
50.0000 mg | ORAL_TABLET | Freq: Once | ORAL | Status: AC
  Administered 2023-12-27: 50 mg via ORAL
  Filled 2023-12-27: qty 1

## 2023-12-27 MED ORDER — ROCURONIUM BROMIDE 10 MG/ML (PF) SYRINGE
PREFILLED_SYRINGE | INTRAVENOUS | Status: DC | PRN
  Administered 2023-12-27: 40 mg via INTRAVENOUS

## 2023-12-27 MED ORDER — MAGNESIUM SULFATE 2 GM/50ML IV SOLN
2.0000 g | Freq: Once | INTRAVENOUS | Status: AC
  Administered 2023-12-27: 2 g via INTRAVENOUS
  Filled 2023-12-27: qty 50

## 2023-12-27 NOTE — Anesthesia Preprocedure Evaluation (Addendum)
 Anesthesia Evaluation  Patient identified by MRN, date of birth, ID band Patient awake    Reviewed: Allergy & Precautions, NPO status , Patient's Chart, lab work & pertinent test results  Airway Mallampati: III  TM Distance: >3 FB Neck ROM: Full    Dental no notable dental hx.    Pulmonary neg pulmonary ROS   Pulmonary exam normal        Cardiovascular negative cardio ROS Normal cardiovascular exam     Neuro/Psych negative neurological ROS     GI/Hepatic negative GI ROS, Neg liver ROS,,,  Endo/Other  negative endocrine ROS    Renal/GU Renal disease     Musculoskeletal negative musculoskeletal ROS (+)    Abdominal   Peds  Hematology  (+) Blood dyscrasia, anemia INR: 1.3   Anesthesia Other Findings Choledocholithiasis, cholangitis, gallstone pancreatitis  Reproductive/Obstetrics                              Anesthesia Physical Anesthesia Plan  ASA: 4  Anesthesia Plan: General   Post-op Pain Management:    Induction: Intravenous  PONV Risk Score and Plan: 3 and Ondansetron , Dexamethasone , Midazolam and Treatment may vary due to age or medical condition  Airway Management Planned: Oral ETT  Additional Equipment:   Intra-op Plan:   Post-operative Plan: Extubation in OR  Informed Consent: I have reviewed the patients History and Physical, chart, labs and discussed the procedure including the risks, benefits and alternatives for the proposed anesthesia with the patient or authorized representative who has indicated his/her understanding and acceptance.     Dental advisory given  Plan Discussed with: CRNA  Anesthesia Plan Comments: (NorEpi drip)         Anesthesia Quick Evaluation

## 2023-12-27 NOTE — Consult Note (Signed)
 Consult Note  Erin Singh 12-17-1965  [id number].    Requesting MD: Lenny Drought, MD Chief Complaint/Reason for Consult: Gallstone pancreatitis  HPI:  Patient is an otherwise healthy 58 year old female who presented to [facility] ED with worsening abdominal pain since Thursday. Known history of cholelithiasis and biliary colic and was trying to control symptoms with diet modification. Patient reports after eating some salad on Thursday she developed epigastric abdominal pain typical of her biliary colic in nature but more severe in intensity. Associated nausea and vomiting. Denies fever or chills. Still making urine and passing flatus. She became hypotensive in the ED at [facility] and was transferred to [facility] for critical care. This morning is more stable on low dose levophed  and reports abdominal pain is significantly improved. No prior abdominal surgery. NKDA and not on blood thinners at baseline. She works in [employer].   ROS: Negative other than HPI  Family History  Problem Relation Age of Onset   Diabetes Mother    Kidney failure Mother    Other Mother        double amputee   Breast cancer Maternal Aunt     Past Medical History:  Diagnosis Date   Fatigue    due to loss of sleep   Wears glasses     No past surgical history on file.  Social History:  reports that she has never smoked. She has never used smokeless tobacco. She reports current alcohol use. She reports that she does not use drugs.  Allergies:  Allergies  Allergen Reactions   Bee Pollen Other (See Comments)    Uncontrollable sneezing    Medications Prior to Admission  Medication Sig Dispense Refill   HYDROcodone -Acetaminophen  (NORCO PO) Take 1 tablet by mouth 2 (two) times daily as needed (severe pain).     omeprazole (PRILOSEC OTC) 20 MG tablet Take 20 mg by mouth daily as needed (indigestion, stomach pain).     ondansetron  (ZOFRAN -ODT) 4 MG disintegrating tablet Take 1 tablet (4 mg total) by mouth every  8 (eight) hours as needed for nausea or vomiting. 20 tablet 0    Blood pressure (!) 91/57, pulse 83, temperature 98.1 F (36.7 C), temperature source Oral, resp. rate 13, last menstrual period 01/31/2014, SpO2 95%. Physical Exam:  General: pleasant, WD, WN female who is laying in bed in NAD HEENT: head is normocephalic, atraumatic.  Sclera are anicteric.  Ears and nose without any masses or lesions.  Mouth is pink and moist Heart: regular, rate, and rhythm.  Palpable radial and pedal pulses bilaterally Lungs: CTAB, no wheezes, rhonchi, or rales noted.  Respiratory effort nonlabored Abd: soft, minimal TTP in epigastric abdomen without peritonitis or guarding, ND, no masses, hernias, or organomegaly MS: all 4 extremities are symmetrical with no cyanosis, clubbing, or edema. Skin: warm and dry with no masses, lesions, or rashes Neuro: Cranial nerves 2-12 grossly intact, sensation is normal throughout Psych: A&Ox3 with an appropriate affect.   Results for orders placed or performed during the hospital encounter of 12/27/23 (from the past 48 hours)  Surgical PCR screen     Status: None   Collection Time: 12/27/23  2:27 AM   Specimen: Nasal Mucosa; Nasal Swab  Result Value Ref Range   MRSA, PCR NEGATIVE NEGATIVE   Staphylococcus aureus NEGATIVE NEGATIVE    Comment: (NOTE) The Xpert SA Assay (FDA approved for NASAL specimens in patients 5 years of age and older), is one component of a comprehensive surveillance program.  It is not intended to diagnose infection nor to guide or monitor treatment. Performed at [facility], [address], [place], [place] [address]   Glucose, capillary     Status: Abnormal   Collection Time: 12/27/23  2:36 AM  Result Value Ref Range   Glucose-Capillary 100 (H) 70 - 99 mg/dL    Comment: Glucose reference range applies only to samples taken after fasting for at least 8 hours.  Type and screen     Status: None   Collection Time: 12/27/23  3:17 AM   Result Value Ref Range   ABO/RH(D) A POS    Antibody Screen NEG    Sample Expiration      12/30/2023,2359 Performed at [facility], [address], [place], [place] [address]   CBC     Status: Abnormal   Collection Time: 12/27/23  3:19 AM  Result Value Ref Range   WBC 18.3 (H) 4.0 - 10.5 K/uL   RBC 3.98 3.87 - 5.11 MIL/uL   Hemoglobin 11.2 (L) 12.0 - 15.0 g/dL   HCT 66.0 (L) 63.9 - 53.9 %   MCV 85.2 80.0 - 100.0 fL   MCH 28.1 26.0 - 34.0 pg   MCHC 33.0 30.0 - 36.0 g/dL   RDW 86.4 88.4 - 84.4 %   Platelets 219 150 - 400 K/uL   nRBC 0.0 0.0 - 0.2 %    Comment: Performed at [facility], [address], [place], [place] [address]  Magnesium      Status: None   Collection Time: 12/27/23  3:19 AM  Result Value Ref Range   Magnesium  1.8 1.7 - 2.4 mg/dL    Comment: Performed at [facility], [address], [place], [place] [address]  Phosphorus     Status: None   Collection Time: 12/27/23  3:19 AM  Result Value Ref Range   Phosphorus 4.2 2.5 - 4.6 mg/dL    Comment: Performed at [facility], [address], [place], [place] [address]  Basic metabolic panel with GFR     Status: Abnormal   Collection Time: 12/27/23  3:19 AM  Result Value Ref Range   Sodium 138 135 - 145 mmol/L   Potassium 3.3 (L) 3.5 - 5.1 mmol/L   Chloride 103 98 - 111 mmol/L   CO2 26 22 - 32 mmol/L   Glucose, Bld 109 (H) 70 - 99 mg/dL    Comment: Glucose reference range applies only to samples taken after fasting for at least 8 hours.   BUN 13 6 - 20 mg/dL   Creatinine, Ser 8.88 (H) 0.44 - 1.00 mg/dL   Calcium 8.2 (L) 8.9 - 10.3 mg/dL   GFR, Estimated 58 (L) >60 mL/min    Comment: (NOTE) Calculated using the CKD-EPI Creatinine Equation (2021)    Anion gap 9 5 - 15    Comment: Performed at [facility], [address], [place], [place] [address]  Hepatic function panel     Status: Abnormal   Collection Time: 12/27/23  3:19 AM  Result Value Ref Range   Total Protein 7.0 6.5  - 8.1 g/dL   Albumin 3.1 (L) 3.5 - 5.0 g/dL   AST 877 (H) 15 - 41 U/L   ALT 163 (H) 0 - 44 U/L   Alkaline Phosphatase 162 (H) 38 - 126 U/L   Total Bilirubin 2.2 (H) 0.0 - 1.2 mg/dL   Bilirubin, Direct 1.0 (H) 0.0 - 0.2 mg/dL  Indirect Bilirubin 1.2 (H) 0.3 - 0.9 mg/dL    Comment: Performed at [facility], [address] [place], [place] [address]  ABO/Rh     Status: None   Collection Time: 12/27/23  4:17 AM  Result Value Ref Range   ABO/RH(D)      A POS Performed at [facility], [address], [place], [place] [address]   Lipase, blood     Status: Abnormal   Collection Time: 12/27/23  4:17 AM  Result Value Ref Range   Lipase 365 (H) 11 - 51 U/L    Comment: Performed at [facility], [address], [place], [place] [address]  C-reactive protein     Status: Abnormal   Collection Time: 12/27/23  4:17 AM  Result Value Ref Range   CRP 6.5 (H) <1.0 mg/dL    Comment: Performed at [facility], [address] [place], [place] [address]  Sedimentation rate     Status: Abnormal   Collection Time: 12/27/23  4:17 AM  Result Value Ref Range   Sed Rate 43 (H) 0 - 22 mm/hr    Comment: Performed at [facility], [address], [place], [place] [address]  Protime-INR     Status: Abnormal   Collection Time: 12/27/23  7:42 AM  Result Value Ref Range   Prothrombin Time 17.0 (H) 11.4 - 15.2 seconds   INR 1.3 (H) 0.8 - 1.2    Comment: (NOTE) INR goal varies based on device and disease states. Performed at [facility], [address] [place], [place] [address]    MR ABDOMEN MRCP WO CONTRAST Result Date: 12/26/2023 CLINICAL DATA:  Cholecystitis EXAM: MRI ABDOMEN WITHOUT CONTRAST  (INCLUDING MRCP) TECHNIQUE: Multiplanar multisequence MR imaging of the abdomen was performed. Heavily T2-weighted images of the biliary and pancreatic ducts were obtained, and three-dimensional MRCP images were rendered by post processing. COMPARISON:  CT and ultrasound  12/26/2023 FINDINGS: Lower chest: No acute pleural or parenchymal lung disease. Hepatobiliary: Multiple gallstones are again identified, with gallbladder wall thickening and pericholecystic fat stranding consistent with acute cholecystitis. There is intrahepatic and extrahepatic biliary duct dilation, with common bile duct measuring up to 12 mm in diameter. There is an obstructing 8 mm calculus within the downstream common bile duct just proximal to the ampulla, reference image 9/series 16. Liver parenchyma demonstrates normal signal characteristics without focal abnormality. Pancreas: Interstitial edema is seen throughout the pancreatic parenchyma, with peripancreatic fat stranding and free fluid consistent with acute pancreatitis. No pancreatic duct dilation. No fluid collection, pseudocyst, or abscess. Spleen:  Within normal limits in size and appearance. Adrenals/Urinary Tract: No masses identified. No evidence of hydronephrosis. Stomach/Bowel: Visualized portions within the abdomen are unremarkable. Vascular/Lymphatic: No pathologically enlarged lymph nodes identified. No abdominal aortic aneurysm demonstrated. Other:  Small volume ascites within the upper abdomen. Musculoskeletal: No suspicious bone lesions identified. IMPRESSION: 1. Cholelithiasis, with gallbladder wall thickening and pericholecystic fluid consistent with acute cholecystitis. 2. Obstructing 8 mm calculus within the downstream common bile duct just proximal to the ampulla, with intrahepatic and extrahepatic biliary duct dilation. Common bile duct measuring up to 12 mm in diameter. 3. Diffuse pancreatic edema and peripancreatic fat stranding consistent with acute pancreatitis. No fluid collection, pseudocyst, or abscess. Electronically Signed   By: Ozell Daring M.D.   On: 12/26/2023 21:02   MR 3D Recon At Scanner Result Date: 12/26/2023 CLINICAL DATA:  Cholecystitis EXAM: MRI ABDOMEN WITHOUT CONTRAST  (INCLUDING  MRCP) TECHNIQUE:  Multiplanar multisequence MR imaging of the abdomen was performed. Heavily T2-weighted images of the biliary and pancreatic ducts were obtained, and three-dimensional MRCP images were rendered by post processing. COMPARISON:  CT and ultrasound 12/26/2023 FINDINGS: Lower chest: No acute pleural or parenchymal lung disease. Hepatobiliary: Multiple gallstones are again identified, with gallbladder wall thickening and pericholecystic fat stranding consistent with acute cholecystitis. There is intrahepatic and extrahepatic biliary duct dilation, with common bile duct measuring up to 12 mm in diameter. There is an obstructing 8 mm calculus within the downstream common bile duct just proximal to the ampulla, reference image 9/series 16. Liver parenchyma demonstrates normal signal characteristics without focal abnormality. Pancreas: Interstitial edema is seen throughout the pancreatic parenchyma, with peripancreatic fat stranding and free fluid consistent with acute pancreatitis. No pancreatic duct dilation. No fluid collection, pseudocyst, or abscess. Spleen:  Within normal limits in size and appearance. Adrenals/Urinary Tract: No masses identified. No evidence of hydronephrosis. Stomach/Bowel: Visualized portions within the abdomen are unremarkable. Vascular/Lymphatic: No pathologically enlarged lymph nodes identified. No abdominal aortic aneurysm demonstrated. Other:  Small volume ascites within the upper abdomen. Musculoskeletal: No suspicious bone lesions identified. IMPRESSION: 1. Cholelithiasis, with gallbladder wall thickening and pericholecystic fluid consistent with acute cholecystitis. 2. Obstructing 8 mm calculus within the downstream common bile duct just proximal to the ampulla, with intrahepatic and extrahepatic biliary duct dilation. Common bile duct measuring up to 12 mm in diameter. 3. Diffuse pancreatic edema and peripancreatic fat stranding consistent with acute pancreatitis. No fluid collection,  pseudocyst, or abscess. Electronically Signed   By: Ozell Daring M.D.   On: 12/26/2023 21:02   US  ABDOMEN LIMITED RUQ (LIVER/GB) Result Date: 12/26/2023 CLINICAL DATA:  Right upper quadrant abdominal pain. EXAM: ULTRASOUND ABDOMEN LIMITED RIGHT UPPER QUADRANT COMPARISON:  CT scan, same date. FINDINGS: Gallbladder: The gallbladder demonstrates marked wall thickening and contraction. Shadowing gallstones are also noted. Small amount pericholecystic fluid. Sonographer describes a sonographic Murphy sign. Common bile duct: Diameter: 11 mm Liver: No focal lesion identified. Within normal limits in parenchymal echogenicity. Portal vein is patent on color Doppler imaging with normal direction of blood flow towards the liver. Other: None. IMPRESSION: 1. Cholelithiasis with marked gallbladder wall thickening and pericholecystic fluid. Findings consistent with acute cholecystitis. 2. Dilated common bile duct measuring 11 mm. 3. Normal sonographic appearance of the liver. Electronically Signed   By: MYRTIS Stammer M.D.   On: 12/26/2023 18:58   CT ABDOMEN PELVIS W CONTRAST Result Date: 12/26/2023 EXAM: CT ABDOMEN AND PELVIS WITH CONTRAST 12/26/2023 06:06:23 PM TECHNIQUE: CT of the abdomen and pelvis was performed with the administration of 100 mL of iohexol  (OMNIPAQUE ) 300 MG/ML solution. Multiplanar reformatted images are provided for review. Automated exposure control, iterative reconstruction, and/or weight-based adjustment of the mA/kV was utilized to reduce the radiation dose to as low as reasonably achievable. COMPARISON: 10/19/2023 CLINICAL HISTORY: epigastric abdominal pain FINDINGS: LOWER CHEST: No acute abnormality. LIVER: Mild intrahepatic biliary ductal dilatation. GALLBLADDER AND BILE DUCTS: Gallbladder wall thickening. Mild extrahepatic biliary ductal dilatation. Common bile duct measures 11 mm. No visible distal obstructing stone or process. SPLEEN: No acute abnormality. PANCREAS: Mild haziness noted  around the pancreatic body and tail compatible with acute pancreatitis. ADRENAL GLANDS: No acute abnormality. KIDNEYS, URETERS AND BLADDER: No stones in the kidneys or ureters. No hydronephrosis. No perinephric or periureteral stranding. Urinary bladder is unremarkable. GI AND BOWEL: Stomach demonstrates no acute abnormality. There is no bowel obstruction. Normal appendix. PERITONEUM AND RETROPERITONEUM: No ascites. No free  air. VASCULATURE: Aorta is normal in caliber. LYMPH NODES: No lymphadenopathy. REPRODUCTIVE ORGANS: Left pelvic mass again noted, compatible with the lower uterine segment fibroid seen on prior ultrasound. This measures 5.6 x 5.3 cm with central calcifications, unchanged. BONES AND SOFT TISSUES: No acute osseous abnormality. No focal soft tissue abnormality. IMPRESSION: 1. Mild haziness around the pancreatic body and tail compatible with acute pancreatitis. 2. Gallbladder wall thickening with mild intrahepatic and extrahepatic biliary ductal dilatation, with a common bile duct diameter of 11 mm and no visible distal obstructing stone or process. This could be further evaluated with right upper quadrant ultrasound to exclude cholecystitis if felt clinically indicated. Electronically signed by: Franky Crease MD 12/26/2023 06:19 PM EST RP Workstation: [id number]      Assessment/Plan Shock related to pancreatitis - low dose levo  Acute biliary pancreatitis  - CT and US  yesterday with acute pancreatitis and cholelithiasis, suspect inflammation around gallbladder is likely more reactive - MRCP with 8 mm stone in CBD just proximal to ampulla - lipase 2200 on admit, 365 this AM - Tbili 2.9 on admit and 2.2 this AM; Alk phos, AST/ALT all downtrending  - WBC 18K, afebrile - suspect more inflammatory from pancreatitis but fine to continue abx for now - GI seeing as well and planning ERCP - will plan cholecystectomy after ERCP provided patient doing well after this - please make NPO after MN - I  have explained the procedure, risks, and aftercare of Laparoscopic cholecystectomy.  Risks include but are not limited to anesthesia (MI, CVA, death, prolonged intubation and aspiration), bleeding, infection, wound problems, hernia, bile leak, injury to common bile duct/liver/intestine, possible need for subtotal cholecystectomy or open cholecystectomy, increased risk of DVT/PE and diarrhea post op. She seems to understand and agrees to proceed.   FEN: NPO, IVF per CCM VTE: SCDs ID: [email or web address]    I reviewed Consultant GI notes, last 24 h vitals and pain scores, last 48 h intake and output, last 24 h labs and trends, last 24 h imaging results, and PCCM notes.  This care required high  level of medical decision making.   Erin Singh, [facility] Surgery 12/27/2023, 10:27 AM Please see Amion for pager number during day hours 7:00am-4:30pm

## 2023-12-27 NOTE — Transfer of Care (Signed)
 Immediate Anesthesia Transfer of Care Note  Patient: Erin Singh  Procedure(s) Performed: ERCP, WITH INTERVENTION IF INDICATED  Patient Location: PACU  Anesthesia Type:General  Level of Consciousness: awake, alert , and oriented  Airway & Oxygen Therapy: Patient Spontanous Breathing and Patient connected to face mask oxygen  Post-op Assessment: Report given to RN and Post -op Vital signs reviewed and stable  Post vital signs: Reviewed and stable  Last Vitals:  Vitals Value Taken Time  BP 116/79 12/27/23 13:52  Temp 37.2 C 12/27/23 13:52  Pulse 105 12/27/23 13:55  Resp 18 12/27/23 13:55  SpO2 96 % 12/27/23 13:55  Vitals shown include unfiled device data.  Last Pain:  Vitals:   12/27/23 1219  TempSrc: Temporal  PainSc: 4          Complications: No notable events documented.

## 2023-12-27 NOTE — Progress Notes (Signed)
 [facility] ADULT ICU REPLACEMENT PROTOCOL   The patient does apply for the [facility] Adult ICU Electrolyte Replacment Protocol based on the criteria listed below:   1.Exclusion criteria: TCTS, ECMO, Dialysis, and Myasthenia Gravis patients 2. Is GFR >/= 30 ml/min? Yes.    Patient's GFR today is 58 3. Is SCr </= 2? Yes.   Patient's SCr is 1.11 mg/dL 4. Did SCr increase >/= 0.5 in 24 hours? No. 5.Pt's weight >40kg  Yes.   6. Abnormal electrolyte(s):   K 3.3, Mg 1.8  7. Electrolytes replaced per protocol 8.  Call MD STAT for K+ </= 2.5, Phos </= 1, or Mag </= 1 Physician:  A. Haze Blackbird R Ravan Schlemmer 12/27/2023 6:03 AM

## 2023-12-27 NOTE — Anesthesia Postprocedure Evaluation (Signed)
 Anesthesia Post Note  Patient: Erin Singh  Procedure(s) Performed: ERCP, WITH INTERVENTION IF INDICATED     Patient location during evaluation: PACU Anesthesia Type: General Level of consciousness: awake Pain management: pain level controlled Vital Signs Assessment: post-procedure vital signs reviewed and stable Respiratory status: spontaneous breathing, nonlabored ventilation and respiratory function stable Cardiovascular status: blood pressure returned to baseline and stable Postop Assessment: no apparent nausea or vomiting Anesthetic complications: no   No notable events documented.  Last Vitals:  Vitals:   12/27/23 2130 12/27/23 2200  BP:  111/73  Pulse: 95 83  Resp: (!) 22 15  Temp:    SpO2: 95% 96%    Last Pain:  Vitals:   12/27/23 2000  TempSrc:   PainSc: 0-No pain                 Gretchen Weinfeld P Indio Santilli

## 2023-12-27 NOTE — Op Note (Signed)
 [facility] Patient Name: Erin Singh Procedure Date : 12/27/2023 MRN: [id number] Attending MD: Aloha Finner , MD, [id number] Date of Birth: [birth year] CSN: [id number] Age: 58 Admit Type: Inpatient Procedure:                ERCP Indications:              Bile duct stone(s), Abnormal MRCP, Exclusion of                            ascending cholangitis, Elevated liver enzymes Providers:                Aloha Finner, MD, Hoy Penner, RN,                            [facility] [place], Technician, Omotara K, CRNA Referring MD:              Medicines:                General Anesthesia, Diclofenac  100 mg rectal,                            Glucagon  0.5 mg IV Complications:            No immediate complications. Estimated Blood Loss:     Estimated blood loss was minimal. Procedure:                Pre-Anesthesia Assessment:                           - Prior to the procedure, a History and Physical                            was performed, and patient medications and                            allergies were reviewed. The patient's tolerance of                            previous anesthesia was also reviewed. The risks                            and benefits of the procedure and the sedation                            options and risks were discussed with the patient.                            All questions were answered, and informed consent                            was obtained. Prior Anticoagulants: The patient has                            taken no anticoagulant or antiplatelet agents. ASA  Grade Assessment: III - A patient with severe                            systemic disease. After reviewing the risks and                            benefits, the patient was deemed in satisfactory                            condition to undergo the procedure.                           After obtaining informed consent, the scope was                             passed under direct vision. Throughout the                            procedure, the patient's blood pressure, pulse, and                            oxygen saturations were monitored continuously. The                            [employer] D single use                            duodenoscope was introduced through the mouth, and                            used to inject contrast into and used to locate the                            major papilla. The ERCP was accomplished without                            difficulty. The patient tolerated the procedure. Scope In: Scope Out: Findings:      The scout film was normal.      The upper GI tract was traversed under direct vision without detailed       examination. No gross lesions were noted in the distal esophagus and at       the gastroesophageal junction. A 1 cm hiatal hernia was present. Patchy       mildly erythematous mucosa without bleeding was found in the entire       examined stomach (Biopsies were taken in the entire examined stomach       through the ERCP scope with the cold forceps for histology and HP       evaluation). No gross lesions were noted in the duodenal bulb, in the       first portion of the duodenum and in the second portion of the duodenum.       The major papilla was normal.      A 0.035 inch x 260 cm straight Hydra Jagwire was passed into the biliary  tree. The Hydratome sphincterotome was passed over the guidewire and the       bile duct was then deeply cannulated. Contrast was injected. I       personally interpreted the bile duct images. Ductal flow of contrast was       adequate. Image quality was adequate. Contrast extended to the hepatic       ducts. Opacification of the entire biliary tree except for the       gallbladder was successful. The lower third of the main bile duct       contained filling defect thought to be sludge. The main bile duct was       mildly dilated.  The largest diameter was 10 mm. An 8 mm biliary       sphincterotomy was made with a monofilament Hydratome sphincterotome       using ERBE electrocautery. There was no post-sphincterotomy bleeding. To       discover objects, the biliary tree and cystic duct were swept with a       retrieval balloon. Sludge was swept from the duct. An occlusion       cholangiogram was performed that showed no further significant biliary       pathology but the gallbladder would not fill.      A pancreatogram was not performed.      The duodenoscope was withdrawn from the patient. Impression:               - No gross lesions in the distal esophagus and in                            the gastroesophageal junction.                           - 1 cm hiatal hernia.                           - Erythematous mucosa in the stomach. Biopsied.                           - No gross lesions in the duodenal bulb, in the                            first portion of the duodenum and in the second                            portion of the duodenum.                           - The major papilla appeared normal.                           - The fluoroscopic examination was suspicious for                            sludge.                           - The entire main bile duct was mildly dilated.                           -  A biliary sphincterotomy was performed.                           - The biliary tree was swept and sludge was found.                           - Cystic duct filled but the gallbladder did not. Recommendation:           - The patient will be observed post-procedure,                            until all discharge criteria are met.                           - Return patient to hospital ward for ongoing care.                           - Check liver enzymes (AST, ALT, alkaline                            phosphatase, bilirubin) in the morning.                           - Observe patient's clinical course.                            - Await path results.                           - Watch for pancreatitis, bleeding, perforation,                            and cholangitis.                           - Cholecystectomy as per timing with surgical                            colleagues. She has no opacification of the                            gallbladder at this time concerning for ongoing                            cholecystitis.                           - Antibiotics as scheduled are reasonable.                           - The findings and recommendations were discussed                            with the patient.                           -  The findings and recommendations were discussed                            with the referring physician. Procedure Code(s):        --- Professional ---                           [phone], Esophagogastroduodenoscopy, flexible,                            transoral; with biopsy, single or multiple Diagnosis Code(s):        --- Professional ---                           K44.9, Diaphragmatic hernia without obstruction or                            gangrene                           K31.89, Other diseases of stomach and duodenum                           K80.50, Calculus of bile duct without cholangitis                            or cholecystitis without obstruction                           R74.8, Abnormal levels of other serum enzymes                           K83.8, Other specified diseases of biliary tract                           R93.2, Abnormal findings on diagnostic imaging of                            liver and biliary tract CPT copyright 2022 American [employer] Association. All rights reserved. The codes documented in this report are preliminary and upon coder review may  be revised to meet current compliance requirements. Aloha Finner, MD 12/27/2023 1:46:45 PM Number of Addenda: 0

## 2023-12-27 NOTE — ED Notes (Signed)
 Patient with [facility] transport to [facility]

## 2023-12-27 NOTE — Interval H&P Note (Signed)
 History and Physical Interval Note:  12/27/2023 12:27 PM  Erin Singh  has presented today for surgery, with the diagnosis of Choledocholithiasis, cholangitis, gallstone pancreatitis.  The various methods of treatment have been discussed with the patient and family. After consideration of risks, benefits and other options for treatment, the patient has consented to  Procedure(s): ERCP, WITH INTERVENTION IF INDICATED (N/A) as a surgical intervention.  The patient's history has been reviewed, patient examined, no change in status, stable for surgery.  I have reviewed the patient's chart and labs.  Questions were answered to the patient's satisfaction.    The risks of an ERCP were discussed at length, including but not limited to the risk of perforation, bleeding, abdominal pain, post-ERCP pancreatitis (while usually mild can be severe and even life threatening).    [name]

## 2023-12-27 NOTE — Anesthesia Procedure Notes (Signed)
 Procedure Name: Intubation Date/Time: 12/27/2023 12:52 PM  Performed by: Moe Graca J, CRNAPre-anesthesia Checklist: Patient identified, Emergency Drugs available, Suction available and Patient being monitored Patient Re-evaluated:Patient Re-evaluated prior to induction Oxygen Delivery Method: Circle System Utilized Preoxygenation: Pre-oxygenation with 100% oxygen Induction Type: IV induction Ventilation: Mask ventilation without difficulty Laryngoscope Size: Miller and 2 Grade View: Grade I Tube type: Oral Tube size: 7.0 mm Number of attempts: 1 Airway Equipment and Method: Stylet and Oral airway Placement Confirmation: ETT inserted through vocal cords under direct vision, positive ETCO2 and breath sounds checked- equal and bilateral Secured at: 21 cm Tube secured with: Tape Dental Injury: Teeth and Oropharynx as per pre-operative assessment

## 2023-12-27 NOTE — ED Notes (Signed)
 EMTALA reviewed by this RN.

## 2023-12-27 NOTE — Progress Notes (Addendum)
 eLink Physician-Brief Progress Note Patient Name: Erin Singh DOB: Mar 07, 1965 MRN: [id number]   Date of Service  12/27/2023  HPI/Events of Note  58 year old female presented to [facility] emergency department with 1 day of abdominal pain nausea and vomiting found to have obstructive choledocholithiasis and acute cholecystitis/pancreatitis.  eICU Interventions  Maintain vasopressors as needed for MAP greater than 65, aggressive IV hydration, maintain n.p.o., analgesics as needed, empiric Zosyn  Anticipate ERCP, cholecystectomy prior to discharge  DVT prophylaxis with SCDs GI prophylaxis not indicated   0350 -added order for peripheral norepinephrine , already running since transfer  Intervention Category Evaluation Type: New Patient Evaluation  Tigran Haynie 12/27/2023, 2:32 AM

## 2023-12-27 NOTE — Progress Notes (Signed)
 NAME:  Erin Singh, MRN:  [id number], DOB:  10/08/1965, LOS: 0 ADMISSION DATE:  12/27/2023, CONSULTATION DATE:  12/27/23 REFERRING MD:  EDP, CHIEF COMPLAINT:  septic shock    History of Present Illness:  Erin Singh 58 year old female presented to [facility] emergency department with 1 day of abdominal pain nausea and vomiting.  No noted fevers, contacts with similar symptoms.  No hematemesis.  Workup was significant for lipase of 2200 AST 238, ALT 253 total bili 2.9, WBCs 14.9.  CT abdomen/pelvis with acute pancreatitis and gallbladder wall thickening with biliary ductal dilation.  Right upper quadrant ultrasound showed cholelithiasis with marked gallbladder wall thickening consistent with acute cholecystitis.  MRCP also showed cholelithiasis with 8 mm CBD calculus GI and patient was recommended for and emergent ERCP, therefore was transferred to [facility] for this procedure.  During ED course patient's blood pressure began dropping, received 2 L IV fluid, Zosyn  and started on norepinephrine  and PCCM was consulted for admission  Arrives to [facility], pain is a bit better, she is anxious. Nausea controlled.  Has been having GB symptoms for a few months trying to modify diet.  Pertinent  Medical History   has a past medical history of Fatigue and Wears glasses.   Significant Hospital Events: Including procedures, antibiotic start and stop dates in addition to other pertinent events   12/5 presented to [facility] ED, admitted to [facility] for ERCP  Interim History / Subjective:  S/p ERCP-sphincterectomy  Objective    Blood pressure (!) 91/57, pulse 83, temperature 98.2 F (36.8 C), temperature source Oral, resp. rate 13, last menstrual period 01/31/2014, SpO2 95%.        Intake/Output Summary (Last 24 hours) at 12/27/2023 0740 Last data filed at 12/27/2023 0700 Gross per 24 hour  Intake 1463.2 ml  Output --  Net 1463.2 ml   There were no vitals filed for this visit.   General: Lying in bed, in  mild distress HEENT: NCAT Respiratory: Symmetrical chest wall movement CVS: S1-S2 normal, no murmur Abdomen: Soft minimal tenderness to palpation  RUQ Extremities: Warm, no edema Neuro: AAO X3, move extremities GU: Deferred   Resolved problem list   Assessment and Plan  58 year old female presents to [facility] with abdominal pain nausea and vomiting, and in septic shock/2/acute pancreatitis, and ascending cholangitis from obstructive stone.  Patient was emergently taken to ERCP on 12/6 for biliary sphincterectomy  Septic shock due to ascending cholangitis Gallstone pancreatitis -s/p ERCP-sphincterectomy 12/6 Ascending cholangitis Abdominal pain   Plan - ICU monitoring and management - Wean off vasopressors as tolerated to maintain MAP> 65 - Serial abdominal exam - Pain management - Serial labs including LFTs, H&H and BMPs - Surgery team on board for lap chole on 12/7 - N.p.o. at midnight - Continue antibiotics Zosyn  - Monitor urinary output  Labs   CBC: Recent Labs  Lab 12/26/23 1624 12/27/23 0319  WBC 14.9* 18.3*  HGB 12.5 11.2*  HCT 38.0 33.9*  MCV 85.0 85.2  PLT 228 219    Basic Metabolic Panel: Recent Labs  Lab 12/26/23 1624 12/27/23 0319  NA 141 138  K 3.7 3.3*  CL 99 103  CO2 26 26  GLUCOSE 114* 109*  BUN 12 13  CREATININE 0.97 1.11*  CALCIUM 9.5 8.2*  MG  --  1.8  PHOS  --  4.2   GFR: Estimated Creatinine Clearance: 54.3 mL/min (A) (by C-G formula based on SCr of 1.11 mg/dL (H)). Recent Labs  Lab 12/26/23 1624 12/26/23  2338 12/27/23 0319  WBC 14.9*  --  18.3*  LATICACIDVEN  --  1.6  --     Liver Function Tests: Recent Labs  Lab 12/26/23 1624 12/27/23 0319  AST 238* 122*  ALT 253* 163*  ALKPHOS 250* 162*  BILITOT 2.9* 2.2*  PROT 8.3* 7.0  ALBUMIN 4.5 3.1*   Recent Labs  Lab 12/26/23 1624 12/27/23 0417  LIPASE 2,212* 365*   No results for input(s): AMMONIA in the last 168 hours.  ABG    Component Value Date/Time   TCO2  23 06/16/2009 0928     Coagulation Profile: No results for input(s): INR, PROTIME in the last 168 hours.  Cardiac Enzymes: No results for input(s): CKTOTAL, CKMB, CKMBINDEX, TROPONINI in the last 168 hours.  HbA1C: No results found for: HGBA1C  CBG: Recent Labs  Lab 12/27/23 0236  GLUCAP 100*    Review of Systems:   Please see the history of present illness. All other systems reviewed and are negative    Past Medical History:  She,  has a past medical history of Fatigue and Wears glasses.   Surgical History:  No past surgical history on file.   Social History:   reports that she has never smoked. She has never used smokeless tobacco. She reports current alcohol use. She reports that she does not use drugs.   Family History:  Her family history includes Breast cancer in her maternal aunt; Diabetes in her mother; Kidney failure in her mother; Other in her mother.   Allergies No Known Allergies   Home Medications  Prior to Admission medications   Medication Sig Start Date End Date Taking? Authorizing Provider  cyclobenzaprine  (FLEXERIL ) 5 MG tablet Take 1 tablet (5 mg total) by mouth 3 (three) times daily as needed. 11/27/22   Menshew, Candida LULLA Kings, PA-C  ondansetron  (ZOFRAN -ODT) 4 MG disintegrating tablet Take 1 tablet (4 mg total) by mouth every 8 (eight) hours as needed for nausea or vomiting. 10/19/23   Herlinda Belton B, FNP     Critical care time: 30 min cc time      CRITICAL CARE  Lenny Drought, MD  [facility] Prefer epic messenger for cross cover needs  Critical care time was exclusive of separately billable procedures and treating other patients.  Critical care was necessary to treat or prevent imminent or life-threatening deterioration.  Critical care was time spent personally by me on the following activities: development of treatment plan with patient and/or surrogate as well as nursing, discussions with  consultants, evaluation of patient's response to treatment, examination of patient, obtaining history from patient or surrogate, ordering and performing treatments and interventions, ordering and review of laboratory studies, ordering and review of radiographic studies, pulse oximetry, re-evaluation of patient's condition and participation in multidisciplinary rounds.

## 2023-12-27 NOTE — Plan of Care (Signed)

## 2023-12-27 NOTE — ED Provider Notes (Signed)
 Reassessed patient, mentation sharp, blood pressures with MAP above 65.  Transport has arrived.   Cyrena Mylar, MD 12/27/23 [phone]

## 2023-12-27 NOTE — H&P (View-Only) (Signed)
 Consultation  Referring Provider: Nicholaus Rolland BRAVO, MD Primary Care Physician:  System, Provider Not In Primary Gastroenterologist:  Unassigned  Reason for Consultation: Acute epigastric pain nausea vomiting, gallstone pancreatitis, choledocholithiasis  HPI: Erin Singh is a 58 y.o. female generally in good health with no known chronic medical problems who relates that he has had a few episodes over the past couple of years of intense epigastric pain that have self resolved.  She recently changed her diet to a more bland vegetable based diet to try to avoid any symptoms.  Her current symptoms had onset on Thursday evening after she had eaten a salad for dinner.  She developed severe epigastric pain nausea and vomiting.  Pain persisted all day into Friday, and she stayed in bed for much of the day.  When the pain failed to subside she presented to the emergency room at [facility] last evening. Initial evaluation there with lab workup showing T. bili 2.9/alk phos 250/AST 238/ALT 253 Lipase 2212 WBC 14.9. She had CT of the abdomen and pelvis that did show gallbladder wall thickening, mild extrahepatic biliary ductal dilation and a CBD of 11 mm there was mild haziness noted around the pancreatic body and tail consistent with pancreatitis. Subsequent MRI/MRCP showed multiple gallstones in the gallbladder with gallbladder wall thickening and Perry cholecystic fat stranding consistent with acute cholecystitis, common bile duct 12 mm and there appears to be a obstructing stone measuring 8 mm within the downstream common bile duct just proximal to the ampulla.  There is interstitial edema noted throughout the pancreatic parenchyma ReSound had also being done showing marked wall thickening and contraction of the gallbladder shadowing stones and small amount of Perry cholecystic fluid.  She developed hypotension in the emergency room, was started on Levophed , Zosyn  initiated, blood cultures done and they  requested transfer here to critical care.  She has stabilized overnight remains on low-dose Levophed  but last blood pressure 107/64 pulse 91 and MAP of 76 She has been afebrile  She says she feels much better this morning no significant pain.  She is understandably anxious as she has never really been sick previously or in the hospital.,  No nausea or vomiting  Labs today show WBC 18.3 on the rise/hemoglobin 11.2/hematocrit 33.9 Potassium 3.3/creatinine 1.11 T. bili 1.2/alk phos 162/AST 136/ALT 163 Lipase 365 INR 1.3     Past Medical History:  Diagnosis Date   Fatigue    due to loss of sleep   Wears glasses     No past surgical history on file.  Prior to Admission medications   Medication Sig Start Date End Date Taking? Authorizing Provider  cyclobenzaprine  (FLEXERIL ) 5 MG tablet Take 1 tablet (5 mg total) by mouth 3 (three) times daily as needed. 11/27/22   Menshew, Candida LULLA Kings, PA-C  ondansetron  (ZOFRAN -ODT) 4 MG disintegrating tablet Take 1 tablet (4 mg total) by mouth every 8 (eight) hours as needed for nausea or vomiting. 10/19/23   Herlinda Belton B, FNP    Current Facility-Administered Medications  Medication Dose Route Frequency Provider Last Rate Last Admin   0.9 %  sodium chloride  infusion  250 mL Intravenous Continuous Paliwal, Aditya, MD 10 mL/hr at 12/27/23 0700 Infusion Verify at 12/27/23 0700   Chlorhexidine  Gluconate Cloth 2 % PADS 6 each  6 each Topical Daily Claudene Toribio BROCKS, MD       docusate sodium  (COLACE) capsule 100 mg  100 mg Oral BID PRN Claudene Toribio BROCKS, MD  HYDROmorphone  (DILAUDID ) injection 0.25 mg  0.25 mg Intravenous Q3H PRN Claudene Toribio BROCKS, MD       HYDROmorphone  (DILAUDID ) injection 0.5 mg  0.5 mg Intravenous Q3H PRN Claudene Toribio BROCKS, MD       lactated ringers  infusion   Intravenous Continuous Claudene Toribio BROCKS, MD 125 mL/hr at 12/27/23 0700 Infusion Verify at 12/27/23 0700   magnesium  sulfate IVPB 2 g 50 mL  2 g Intravenous Once Paliwal, Ria,  MD       mupirocin  ointment (BACTROBAN ) 2 % 1 Application  1 Application Nasal BID Claudene Toribio BROCKS, MD   1 Application at 12/27/23 9687   norepinephrine  (LEVOPHED ) 4mg  in (0.016 mg/mL) premix infusion  0-10 mcg/min Intravenous Titrated Paliwal, Ria, MD 15 mL/hr at 12/27/23 0700 4 mcg/min at 12/27/23 0700   ondansetron  (ZOFRAN ) injection 4 mg  4 mg Intravenous Q6H PRN Claudene Toribio BROCKS, MD       piperacillin -tazobactam (ZOSYN ) IVPB 3.375 g  3.375 g Intravenous Q8H Mattie Marvetta SQUIBB, RPH 12.5 mL/hr at 12/27/23 0700 Infusion Verify at 12/27/23 0700   potassium chloride  10 mEq in 100 mL IVPB  10 mEq Intravenous Q1 Hr x 6 Paliwal, Aditya, MD        Allergies as of 12/26/2023   (No Known Allergies)    Family History  Problem Relation Age of Onset   Diabetes Mother    Kidney failure Mother    Other Mother        double amputee   Breast cancer Maternal Aunt     Social History   Socioeconomic History   Marital status: Legally Separated    Spouse name: Not on file   Number of children: Not on file   Years of education: Not on file   Highest education level: Not on file  Occupational History   Not on file  Tobacco Use   Smoking status: Never   Smokeless tobacco: Never  Vaping Use   Vaping status: Never Used  Substance and Sexual Activity   Alcohol use: Yes    Comment: Once every 6 months   Drug use: No   Sexual activity: Not on file  Other Topics Concern   Not on file  Social History Narrative   Not on file   Social Drivers of Health   Financial Resource Strain: Low Risk  (11/25/2023)   Received from [facility]   Overall Financial Resource Strain (CARDIA)    How hard is it for you to pay for the very basics like food, housing, medical care, and heating?: Not hard at all  Food Insecurity: No Food Insecurity (11/25/2023)   Received from [facility]   Hunger Vital Sign    Within the past 12 months, you worried that your food would run out before you got the money to  buy more.: Never true    Within the past 12 months, the food you bought just didn't last and you didn't have money to get more.: Never true  Transportation Needs: No Transportation Needs (11/25/2023)   Received from [facility] - Transportation    In the past 12 months, has lack of transportation kept you from medical appointments or from getting medications?: No    In the past 12 months, has lack of transportation kept you from meetings, work, or from getting things needed for daily living?: No  Physical Activity: Inactive (11/25/2023)   Received from [facility]   Exercise Vital Sign    On  average, how many days per week do you engage in moderate to strenuous exercise (like a brisk walk)?: 0 days    Minutes of Exercise per Session: Not on file  Stress: No Stress Concern Present (11/25/2023)   Received from [facility] - Occupational Stress Questionnaire    Do you feel stress - tense, restless, nervous, or anxious, or unable to sleep at night because your mind is troubled all the time - these days?: Not at all  Social Connections: Moderately Integrated (11/25/2023)   Received from [facility]   Social Network    How would you rate your social network (family, work, friends)?: Adequate participation with social networks  Intimate Partner Violence: Not At Risk (11/25/2023)   Received from [facility]   HITS    Over the last 12 months how often did your partner physically hurt you?: Never    Over the last 12 months how often did your partner insult you or talk down to you?: Never    Over the last 12 months how often did your partner threaten you with physical harm?: Never    Over the last 12 months how often did your partner scream or curse at you?: Never    Review of Systems: Pertinent positive and negative review of systems were noted in the above HPI section.  All other review of systems was otherwise negative.   Physical  Exam: Vital signs in last 24 hours: Temp:  [98 F (36.7 C)-98.9 F (37.2 C)] 98.1 F (36.7 C) (12/06 0806) Pulse Rate:  [52-108] 83 (12/06 0630) Resp:  [12-23] 13 (12/06 0630) BP: (74-125)/(44-105) 91/57 (12/06 0630) SpO2:  [91 %-100 %] 95 % (12/06 0630) Weight:  [69.9 kg] 69.9 kg (12/05 1624) Last BM Date : 12/26/23 General:   Alert,  Well-developed, well-nourished, African-American female pleasant and cooperative in NAD Head:  Normocephalic and atraumatic. Eyes:  Sclera clear, no icterus.   Conjunctiva pink. Ears:  Normal auditory acuity. Nose:  No deformity, discharge,  or lesions. Mouth:  No deformity or lesions.   Neck:  Supple; no masses or thyromegaly. Lungs:  Clear throughout to auscultation.   No wheezes, crackles, or rhonchi.  Heart:  Regular rate and rhythm; no murmurs, clicks, rubs,  or gallops. Abdomen:  Soft, bowel sounds are present, she is tender in the right upper quadrant, no rebound, no palpable mass or hepatosplenomegaly Rectal: Not done Msk:  Symmetrical without gross deformities. . Pulses:  Normal pulses noted. Extremities:  Without clubbing or edema. Neurologic:  Alert and  oriented x4;  grossly normal neurologically. Skin:  Intact without significant lesions or rashes.. Psych:  Alert and cooperative. Normal mood and affect.  Intake/Output from previous day: 12/05 0701 - 12/06 0700 In: 1463.2 [I.V.:383.9; IV Piggyback:1079.3] Out: -  Intake/Output this shift: No intake/output data recorded.  Lab Results: Recent Labs    12/26/23 1624 12/27/23 0319  WBC 14.9* 18.3*  HGB 12.5 11.2*  HCT 38.0 33.9*  PLT 228 219   BMET Recent Labs    12/26/23 1624 12/27/23 0319  NA 141 138  K 3.7 3.3*  CL 99 103  CO2 26 26  GLUCOSE 114* 109*  BUN 12 13  CREATININE 0.97 1.11*  CALCIUM 9.5 8.2*   LFT Recent Labs    12/27/23 0319  PROT 7.0  ALBUMIN 3.1*  AST 122*  ALT 163*  ALKPHOS 162*  BILITOT 2.2*  BILIDIR 1.0*  IBILI 1.2*   PT/INR Recent  Labs    12/27/23 0742  LABPROT 17.0*  INR 1.3*     IMPRESSION:  #63 58 year old female who had been having sporadic episodes of epigastric pain over the past year or so.  Now with acute onset of severe epigastric pain nausea and vomiting Thursday evening 12/25/2023.  Symptoms lasted all day yesterday without resolution at which time she came to the emergency room.  Workup with labs and imaging to include MRI/MRCP are consistent with cholelithiasis, cholecystitis, mild gallstone pancreatitis and choledocholithiasis with 1 stone identified in the distal common bile duct.  She has been afebrile but did develop hypotension in the ER, required pressor support and transferred to [facility].  She has improved overnight, no longer hypotensive, remains afebrile and pain for the most part has resolved.  Leukocytosis worsened, LFTs have improved. Blood cultures pending  She remains on low-dose Levophed , On IV Zosyn   Plan; keep n.p.o. No DVT prophylaxis i.e. Lovenox  Continue IV Zosyn  Will schedule patient for ERCP with stone extraction with Dr. Wilhelmenia for later today.  I have discussed the procedure, indications risks and benefits in detail at bedside with the patient this morning.  We specifically discussed risks of anesthesia, 3 to 4% risk of pancreatitis, bleeding, perforation and procedure failure.  She is agreeable to proceed.  Repeat labs in a.m. Surgery also seeing patient this a.m. and tentative plan is for laparoscopic cholecystectomy tomorrow 12/28/2023. GI will follow with you      Amy EsterwoodPA-C  12/27/2023, 8:24 AM     Attending Physician's Attestation   I have taken an interval history, reviewed the chart and examined the patient.   This is a patient that the GI service is asked to evaluate in the setting of symptomatic cholelithiasis and choledocholithiasis with gallstone pancreatitis.  Patient over the course of the last month has had progressive increase in episodes of  epigastric abdominal discomfort.  Things progressed over the course of Thursday into Friday and she had persisting symptoms of nausea vomiting and abdominal pain.  She came in for further evaluation due to the persistence of her discomfort.  She denied any fevers or chills.  Interestingly, she had Imaging pursued in September when she had presented with abdominal pain and her gallbladder was noted to be normal in size without biliary duct dilation or cholelithiasis being noted.  They thought that there was concern for a pelvic fibroid that would require additional workup with OB.  Upon her presentation here CT scan suggested new ductal dilation without overt choledocholithiasis or cholelithiasis.  An ultrasound was performed that showed evidence of cholelithiasis and a dilated bile duct.  This was subsequently followed up with MRI/MRCP as a result of her liver test abnormalities and this showed evidence of a dilated bile duct and a potential stone in the distal duct (I have personally reviewed all of the imaging performed over the course of the last 4 months).  While in the ED she was given IV fluids for her gallstone pancreatitis and she had progression in her hypotension leading her to be transferred to [facility] for further evaluation and potential treatment.  She is in the ICU on Levophed  at this time.  Otherwise a relatively healthy individual.  On exam today she is tender to palpation in her upper epigastrium and right upper quadrant and left upper quadrant (5 out of 10 upon deep palpation).  She states that she is making urine.  She states that her pain is adequately controlled if she is not moving.  She has no appetite.  She has scleral icterus.  No other signs on her physical exam of chronic liver disease.  Lipase has downtrend.  INR returned slightly elevated at 1.3.  Patient's clinical history is most suggestive of gallstone pancreatitis.  Due to her progressive leukocytosis and persistent hypotension  requiring pressor requirement, next up in her evaluation is ERCP urgently as we have concerns for cholangitis (though her elevated white blood cell count could also be a response from her underlying pancreatitis as well).  Looking at her anatomy she seems to have a more inferior positioning of her ampulla which may think things more complex in the setting of her acute pancreatitis as well, but only time will tell.  The risks of an ERCP were discussed at length, including but not limited to the risk of perforation, bleeding, abdominal pain, post-ERCP pancreatitis (while usually mild can be severe and even life threatening). The risks of an EUS including intestinal perforation, bleeding, infection, aspiration, and medication effects were discussed as was the possibility it may not give a definitive diagnosis if a biopsy is performed.  When a biopsy of the pancreas is done as part of the EUS, there is an additional risk of pancreatitis at the rate of about 1-2%.  It was explained that procedure related pancreatitis is typically mild, although it can be severe and even life threatening, which is why we do not perform random pancreatic biopsies and only biopsy a lesion/area we feel is concerning enough to warrant the risk.  If all goes well, hopefully she will be able to have surgery in the coming days for definitive treatment.   I agree with the Advanced Practitioner's note, impression, and recommendations with updates and my documentation as noted above.  The majority of the medical decision making/process, formulation of the impression/plan of action for the patient were performed by me with substantive portion of this encounter (>50% time spent including complete performance of at least one of the key components of MDM, History, and/or Exam).   Aloha Finner, MD [facility] Office # [phone]

## 2023-12-27 NOTE — Consult Note (Addendum)
 Consultation  Referring Provider: Nicholaus Rolland BRAVO, MD Primary Care Physician:  System, Provider Not In Primary Gastroenterologist:  Unassigned  Reason for Consultation: Acute epigastric pain nausea vomiting, gallstone pancreatitis, choledocholithiasis  HPI: Erin Singh is a 58 y.o. female generally in good health with no known chronic medical problems who relates that he has had a few episodes over the past couple of years of intense epigastric pain that have self resolved.  She recently changed her diet to a more bland vegetable based diet to try to avoid any symptoms.  Her current symptoms had onset on Thursday evening after she had eaten a salad for dinner.  She developed severe epigastric pain nausea and vomiting.  Pain persisted all day into Friday, and she stayed in bed for much of the day.  When the pain failed to subside she presented to the emergency room at [facility] last evening. Initial evaluation there with lab workup showing T. bili 2.9/alk phos 250/AST 238/ALT 253 Lipase 2212 WBC 14.9. She had CT of the abdomen and pelvis that did show gallbladder wall thickening, mild extrahepatic biliary ductal dilation and a CBD of 11 mm there was mild haziness noted around the pancreatic body and tail consistent with pancreatitis. Subsequent MRI/MRCP showed multiple gallstones in the gallbladder with gallbladder wall thickening and Perry cholecystic fat stranding consistent with acute cholecystitis, common bile duct 12 mm and there appears to be a obstructing stone measuring 8 mm within the downstream common bile duct just proximal to the ampulla.  There is interstitial edema noted throughout the pancreatic parenchyma ReSound had also being done showing marked wall thickening and contraction of the gallbladder shadowing stones and small amount of Perry cholecystic fluid.  She developed hypotension in the emergency room, was started on Levophed , Zosyn  initiated, blood cultures done and they  requested transfer here to critical care.  She has stabilized overnight remains on low-dose Levophed  but last blood pressure 107/64 pulse 91 and MAP of 76 She has been afebrile  She says she feels much better this morning no significant pain.  She is understandably anxious as she has never really been sick previously or in the hospital.,  No nausea or vomiting  Labs today show WBC 18.3 on the rise/hemoglobin 11.2/hematocrit 33.9 Potassium 3.3/creatinine 1.11 T. bili 1.2/alk phos 162/AST 136/ALT 163 Lipase 365 INR 1.3     Past Medical History:  Diagnosis Date   Fatigue    due to loss of sleep   Wears glasses     No past surgical history on file.  Prior to Admission medications   Medication Sig Start Date End Date Taking? Authorizing Provider  cyclobenzaprine  (FLEXERIL ) 5 MG tablet Take 1 tablet (5 mg total) by mouth 3 (three) times daily as needed. 11/27/22   Menshew, Candida LULLA Kings, PA-C  ondansetron  (ZOFRAN -ODT) 4 MG disintegrating tablet Take 1 tablet (4 mg total) by mouth every 8 (eight) hours as needed for nausea or vomiting. 10/19/23   Herlinda Belton B, FNP    Current Facility-Administered Medications  Medication Dose Route Frequency Provider Last Rate Last Admin   0.9 %  sodium chloride  infusion  250 mL Intravenous Continuous Paliwal, Aditya, MD 10 mL/hr at 12/27/23 0700 Infusion Verify at 12/27/23 0700   Chlorhexidine  Gluconate Cloth 2 % PADS 6 each  6 each Topical Daily Claudene Toribio BROCKS, MD       docusate sodium  (COLACE) capsule 100 mg  100 mg Oral BID PRN Claudene Toribio BROCKS, MD  HYDROmorphone  (DILAUDID ) injection 0.25 mg  0.25 mg Intravenous Q3H PRN Claudene Toribio BROCKS, MD       HYDROmorphone  (DILAUDID ) injection 0.5 mg  0.5 mg Intravenous Q3H PRN Claudene Toribio BROCKS, MD       lactated ringers  infusion   Intravenous Continuous Claudene Toribio BROCKS, MD 125 mL/hr at 12/27/23 0700 Infusion Verify at 12/27/23 0700   magnesium  sulfate IVPB 2 g 50 mL  2 g Intravenous Once Paliwal, Ria,  MD       mupirocin  ointment (BACTROBAN ) 2 % 1 Application  1 Application Nasal BID Claudene Toribio BROCKS, MD   1 Application at 12/27/23 9687   norepinephrine  (LEVOPHED ) 4mg  in (0.016 mg/mL) premix infusion  0-10 mcg/min Intravenous Titrated Paliwal, Ria, MD 15 mL/hr at 12/27/23 0700 4 mcg/min at 12/27/23 0700   ondansetron  (ZOFRAN ) injection 4 mg  4 mg Intravenous Q6H PRN Claudene Toribio BROCKS, MD       piperacillin -tazobactam (ZOSYN ) IVPB 3.375 g  3.375 g Intravenous Q8H Mattie Marvetta SQUIBB, RPH 12.5 mL/hr at 12/27/23 0700 Infusion Verify at 12/27/23 0700   potassium chloride  10 mEq in 100 mL IVPB  10 mEq Intravenous Q1 Hr x 6 Paliwal, Aditya, MD        Allergies as of 12/26/2023   (No Known Allergies)    Family History  Problem Relation Age of Onset   Diabetes Mother    Kidney failure Mother    Other Mother        double amputee   Breast cancer Maternal Aunt     Social History   Socioeconomic History   Marital status: Legally Separated    Spouse name: Not on file   Number of children: Not on file   Years of education: Not on file   Highest education level: Not on file  Occupational History   Not on file  Tobacco Use   Smoking status: Never   Smokeless tobacco: Never  Vaping Use   Vaping status: Never Used  Substance and Sexual Activity   Alcohol use: Yes    Comment: Once every 6 months   Drug use: No   Sexual activity: Not on file  Other Topics Concern   Not on file  Social History Narrative   Not on file   Social Drivers of Health   Financial Resource Strain: Low Risk  (11/25/2023)   Received from [facility]   Overall Financial Resource Strain (CARDIA)    How hard is it for you to pay for the very basics like food, housing, medical care, and heating?: Not hard at all  Food Insecurity: No Food Insecurity (11/25/2023)   Received from [facility]   Hunger Vital Sign    Within the past 12 months, you worried that your food would run out before you got the money to  buy more.: Never true    Within the past 12 months, the food you bought just didn't last and you didn't have money to get more.: Never true  Transportation Needs: No Transportation Needs (11/25/2023)   Received from [facility] - Transportation    In the past 12 months, has lack of transportation kept you from medical appointments or from getting medications?: No    In the past 12 months, has lack of transportation kept you from meetings, work, or from getting things needed for daily living?: No  Physical Activity: Inactive (11/25/2023)   Received from [facility]   Exercise Vital Sign    On  average, how many days per week do you engage in moderate to strenuous exercise (like a brisk walk)?: 0 days    Minutes of Exercise per Session: Not on file  Stress: No Stress Concern Present (11/25/2023)   Received from [facility] - Occupational Stress Questionnaire    Do you feel stress - tense, restless, nervous, or anxious, or unable to sleep at night because your mind is troubled all the time - these days?: Not at all  Social Connections: Moderately Integrated (11/25/2023)   Received from [facility]   Social Network    How would you rate your social network (family, work, friends)?: Adequate participation with social networks  Intimate Partner Violence: Not At Risk (11/25/2023)   Received from [facility]   HITS    Over the last 12 months how often did your partner physically hurt you?: Never    Over the last 12 months how often did your partner insult you or talk down to you?: Never    Over the last 12 months how often did your partner threaten you with physical harm?: Never    Over the last 12 months how often did your partner scream or curse at you?: Never    Review of Systems: Pertinent positive and negative review of systems were noted in the above HPI section.  All other review of systems was otherwise negative.   Physical  Exam: Vital signs in last 24 hours: Temp:  [98 F (36.7 C)-98.9 F (37.2 C)] 98.1 F (36.7 C) (12/06 0806) Pulse Rate:  [52-108] 83 (12/06 0630) Resp:  [12-23] 13 (12/06 0630) BP: (74-125)/(44-105) 91/57 (12/06 0630) SpO2:  [91 %-100 %] 95 % (12/06 0630) Weight:  [69.9 kg] 69.9 kg (12/05 1624) Last BM Date : 12/26/23 General:   Alert,  Well-developed, well-nourished, African-American female pleasant and cooperative in NAD Head:  Normocephalic and atraumatic. Eyes:  Sclera clear, no icterus.   Conjunctiva pink. Ears:  Normal auditory acuity. Nose:  No deformity, discharge,  or lesions. Mouth:  No deformity or lesions.   Neck:  Supple; no masses or thyromegaly. Lungs:  Clear throughout to auscultation.   No wheezes, crackles, or rhonchi.  Heart:  Regular rate and rhythm; no murmurs, clicks, rubs,  or gallops. Abdomen:  Soft, bowel sounds are present, she is tender in the right upper quadrant, no rebound, no palpable mass or hepatosplenomegaly Rectal: Not done Msk:  Symmetrical without gross deformities. . Pulses:  Normal pulses noted. Extremities:  Without clubbing or edema. Neurologic:  Alert and  oriented x4;  grossly normal neurologically. Skin:  Intact without significant lesions or rashes.. Psych:  Alert and cooperative. Normal mood and affect.  Intake/Output from previous day: 12/05 0701 - 12/06 0700 In: 1463.2 [I.V.:383.9; IV Piggyback:1079.3] Out: -  Intake/Output this shift: No intake/output data recorded.  Lab Results: Recent Labs    12/26/23 1624 12/27/23 0319  WBC 14.9* 18.3*  HGB 12.5 11.2*  HCT 38.0 33.9*  PLT 228 219   BMET Recent Labs    12/26/23 1624 12/27/23 0319  NA 141 138  K 3.7 3.3*  CL 99 103  CO2 26 26  GLUCOSE 114* 109*  BUN 12 13  CREATININE 0.97 1.11*  CALCIUM 9.5 8.2*   LFT Recent Labs    12/27/23 0319  PROT 7.0  ALBUMIN 3.1*  AST 122*  ALT 163*  ALKPHOS 162*  BILITOT 2.2*  BILIDIR 1.0*  IBILI 1.2*   PT/INR Recent  Labs    12/27/23 0742  LABPROT 17.0*  INR 1.3*     IMPRESSION:  #63 58 year old female who had been having sporadic episodes of epigastric pain over the past year or so.  Now with acute onset of severe epigastric pain nausea and vomiting Thursday evening 12/25/2023.  Symptoms lasted all day yesterday without resolution at which time she came to the emergency room.  Workup with labs and imaging to include MRI/MRCP are consistent with cholelithiasis, cholecystitis, mild gallstone pancreatitis and choledocholithiasis with 1 stone identified in the distal common bile duct.  She has been afebrile but did develop hypotension in the ER, required pressor support and transferred to [facility].  She has improved overnight, no longer hypotensive, remains afebrile and pain for the most part has resolved.  Leukocytosis worsened, LFTs have improved. Blood cultures pending  She remains on low-dose Levophed , On IV Zosyn   Plan; keep n.p.o. No DVT prophylaxis i.e. Lovenox  Continue IV Zosyn  Will schedule patient for ERCP with stone extraction with Dr. Wilhelmenia for later today.  I have discussed the procedure, indications risks and benefits in detail at bedside with the patient this morning.  We specifically discussed risks of anesthesia, 3 to 4% risk of pancreatitis, bleeding, perforation and procedure failure.  She is agreeable to proceed.  Repeat labs in a.m. Surgery also seeing patient this a.m. and tentative plan is for laparoscopic cholecystectomy tomorrow 12/28/2023. GI will follow with you      Erin EsterwoodPA-C  12/27/2023, 8:24 AM     Attending Physician's Attestation   I have taken an interval history, reviewed the chart and examined the patient.   This is a patient that the GI service is asked to evaluate in the setting of symptomatic cholelithiasis and choledocholithiasis with gallstone pancreatitis.  Patient over the course of the last month has had progressive increase in episodes of  epigastric abdominal discomfort.  Things progressed over the course of Thursday into Friday and she had persisting symptoms of nausea vomiting and abdominal pain.  She came in for further evaluation due to the persistence of her discomfort.  She denied any fevers or chills.  Interestingly, she had Imaging pursued in September when she had presented with abdominal pain and her gallbladder was noted to be normal in size without biliary duct dilation or cholelithiasis being noted.  They thought that there was concern for a pelvic fibroid that would require additional workup with OB.  Upon her presentation here CT scan suggested new ductal dilation without overt choledocholithiasis or cholelithiasis.  An ultrasound was performed that showed evidence of cholelithiasis and a dilated bile duct.  This was subsequently followed up with MRI/MRCP as a result of her liver test abnormalities and this showed evidence of a dilated bile duct and a potential stone in the distal duct (I have personally reviewed all of the imaging performed over the course of the last 4 months).  While in the ED she was given IV fluids for her gallstone pancreatitis and she had progression in her hypotension leading her to be transferred to [facility] for further evaluation and potential treatment.  She is in the ICU on Levophed  at this time.  Otherwise a relatively healthy individual.  On exam today she is tender to palpation in her upper epigastrium and right upper quadrant and left upper quadrant (5 out of 10 upon deep palpation).  She states that she is making urine.  She states that her pain is adequately controlled if she is not moving.  She has no appetite.  She has scleral icterus.  No other signs on her physical exam of chronic liver disease.  Lipase has downtrend.  INR returned slightly elevated at 1.3.  Patient's clinical history is most suggestive of gallstone pancreatitis.  Due to her progressive leukocytosis and persistent hypotension  requiring pressor requirement, next up in her evaluation is ERCP urgently as we have concerns for cholangitis (though her elevated white blood cell count could also be a response from her underlying pancreatitis as well).  Looking at her anatomy she seems to have a more inferior positioning of her ampulla which may think things more complex in the setting of her acute pancreatitis as well, but only time will tell.  The risks of an ERCP were discussed at length, including but not limited to the risk of perforation, bleeding, abdominal pain, post-ERCP pancreatitis (while usually mild can be severe and even life threatening). The risks of an EUS including intestinal perforation, bleeding, infection, aspiration, and medication effects were discussed as was the possibility it may not give a definitive diagnosis if a biopsy is performed.  When a biopsy of the pancreas is done as part of the EUS, there is an additional risk of pancreatitis at the rate of about 1-2%.  It was explained that procedure related pancreatitis is typically mild, although it can be severe and even life threatening, which is why we do not perform random pancreatic biopsies and only biopsy a lesion/area we feel is concerning enough to warrant the risk.  If all goes well, hopefully she will be able to have surgery in the coming days for definitive treatment.   I agree with the Advanced Practitioner's note, impression, and recommendations with updates and my documentation as noted above.  The majority of the medical decision making/process, formulation of the impression/plan of action for the patient were performed by me with substantive portion of this encounter (>50% time spent including complete performance of at least one of the key components of MDM, History, and/or Exam).   Aloha Finner, MD [facility] Office # [phone]

## 2023-12-27 NOTE — ED Notes (Signed)
 Pt accepted to [facility] Bed 3M12/Accepting [facility] Smith/Report @ [phone]/Mze Coleen.

## 2023-12-28 ENCOUNTER — Inpatient Hospital Stay (HOSPITAL_COMMUNITY): Admitting: Registered Nurse

## 2023-12-28 ENCOUNTER — Encounter (HOSPITAL_COMMUNITY): Admission: EM | Disposition: A | Payer: Self-pay | Source: Other Acute Inpatient Hospital

## 2023-12-28 ENCOUNTER — Other Ambulatory Visit: Payer: Self-pay

## 2023-12-28 ENCOUNTER — Encounter (HOSPITAL_COMMUNITY): Payer: Self-pay | Admitting: Internal Medicine

## 2023-12-28 HISTORY — PX: INDOCYANINE GREEN FLUORESCENCE IMAGING (ICG): SHX7595

## 2023-12-28 HISTORY — PX: CHOLECYSTECTOMY: SHX55

## 2023-12-28 LAB — HEPATIC FUNCTION PANEL
ALT: 96 U/L — ABNORMAL HIGH (ref 0–44)
AST: 50 U/L — ABNORMAL HIGH (ref 15–41)
Albumin: 2.5 g/dL — ABNORMAL LOW (ref 3.5–5.0)
Alkaline Phosphatase: 118 U/L (ref 38–126)
Bilirubin, Direct: 0.3 mg/dL — ABNORMAL HIGH (ref 0.0–0.2)
Indirect Bilirubin: 0.9 mg/dL (ref 0.3–0.9)
Total Bilirubin: 1.2 mg/dL (ref 0.0–1.2)
Total Protein: 6 g/dL — ABNORMAL LOW (ref 6.5–8.1)

## 2023-12-28 LAB — CBC
HCT: 28.1 % — ABNORMAL LOW (ref 36.0–46.0)
Hemoglobin: 9.2 g/dL — ABNORMAL LOW (ref 12.0–15.0)
MCH: 28.2 pg (ref 26.0–34.0)
MCHC: 32.7 g/dL (ref 30.0–36.0)
MCV: 86.2 fL (ref 80.0–100.0)
Platelets: 151 K/uL (ref 150–400)
RBC: 3.26 MIL/uL — ABNORMAL LOW (ref 3.87–5.11)
RDW: 13.6 % (ref 11.5–15.5)
WBC: 7.7 K/uL (ref 4.0–10.5)
nRBC: 0 % (ref 0.0–0.2)

## 2023-12-28 LAB — PHOSPHORUS: Phosphorus: 2.4 mg/dL — ABNORMAL LOW (ref 2.5–4.6)

## 2023-12-28 LAB — MAGNESIUM: Magnesium: 2.2 mg/dL (ref 1.7–2.4)

## 2023-12-28 LAB — BASIC METABOLIC PANEL WITH GFR
Anion gap: 13 (ref 5–15)
BUN: 8 mg/dL (ref 6–20)
CO2: 23 mmol/L (ref 22–32)
Calcium: 8.6 mg/dL — ABNORMAL LOW (ref 8.9–10.3)
Chloride: 104 mmol/L (ref 98–111)
Creatinine, Ser: 0.74 mg/dL (ref 0.44–1.00)
GFR, Estimated: >60 mL/min (ref >60)
Glucose, Bld: 109 mg/dL — ABNORMAL HIGH (ref 70–99)
Potassium: 4.1 mmol/L (ref 3.5–5.1)
Sodium: 140 mmol/L (ref 135–145)

## 2023-12-28 LAB — LIPASE, BLOOD: Lipase: 146 U/L — ABNORMAL HIGH (ref 11–51)

## 2023-12-28 SURGERY — LAPAROSCOPIC CHOLECYSTECTOMY
Anesthesia: General

## 2023-12-28 MED ORDER — ENOXAPARIN SODIUM 40 MG/0.4ML IJ SOSY
40.0000 mg | PREFILLED_SYRINGE | INTRAMUSCULAR | Status: DC
  Administered 2023-12-29 – 2023-12-30 (×2): 40 mg via SUBCUTANEOUS
  Filled 2023-12-28 (×2): qty 0.4

## 2023-12-28 MED ORDER — DEXAMETHASONE SOD PHOSPHATE PF 10 MG/ML IJ SOLN
INTRAMUSCULAR | Status: DC | PRN
  Administered 2023-12-28: 10 mg via INTRAVENOUS

## 2023-12-28 MED ORDER — CHLORHEXIDINE GLUCONATE 0.12 % MT SOLN
OROMUCOSAL | Status: AC
  Filled 2023-12-28: qty 15

## 2023-12-28 MED ORDER — CHLORHEXIDINE GLUCONATE 0.12 % MT SOLN
15.0000 mL | Freq: Once | OROMUCOSAL | Status: AC
  Administered 2023-12-28: 15 mL via OROMUCOSAL

## 2023-12-28 MED ORDER — ONDANSETRON HCL 4 MG/2ML IJ SOLN
INTRAMUSCULAR | Status: DC | PRN
  Administered 2023-12-28: 4 mg via INTRAVENOUS

## 2023-12-28 MED ORDER — LIDOCAINE 2% (20 MG/ML) 5 ML SYRINGE
INTRAMUSCULAR | Status: DC | PRN
  Administered 2023-12-28: 100 mg via INTRAVENOUS

## 2023-12-28 MED ORDER — SODIUM CHLORIDE 0.9 % IR SOLN
Status: DC | PRN
  Administered 2023-12-28: 1000 mL

## 2023-12-28 MED ORDER — FENTANYL CITRATE (PF) 100 MCG/2ML IJ SOLN
INTRAMUSCULAR | Status: AC
  Filled 2023-12-28: qty 2

## 2023-12-28 MED ORDER — OXYCODONE HCL 5 MG/5ML PO SOLN: 5.0000 mg | Freq: Once | ORAL | Status: DC | PRN

## 2023-12-28 MED ORDER — ORAL CARE MOUTH RINSE: 15.0000 mL | Freq: Once | OROMUCOSAL | Status: AC

## 2023-12-28 MED ORDER — ROCURONIUM BROMIDE 10 MG/ML (PF) SYRINGE
PREFILLED_SYRINGE | INTRAVENOUS | Status: DC | PRN
  Administered 2023-12-28: 70 mg via INTRAVENOUS

## 2023-12-28 MED ORDER — IBUPROFEN 800 MG PO TABS: 800.0000 mg | ORAL_TABLET | Freq: Three times a day (TID) | ORAL | Status: DC | PRN

## 2023-12-28 MED ORDER — PROPOFOL 10 MG/ML IV BOLUS
INTRAVENOUS | Status: DC | PRN
  Administered 2023-12-28: 110 mg via INTRAVENOUS

## 2023-12-28 MED ORDER — DEXMEDETOMIDINE HCL IN NACL 80 MCG/20ML IV SOLN
INTRAVENOUS | Status: DC | PRN
  Administered 2023-12-28: 12 ug via INTRAVENOUS
  Administered 2023-12-28: 8 ug via INTRAVENOUS

## 2023-12-28 MED ORDER — OXYCODONE HCL 5 MG PO TABS
5.0000 mg | ORAL_TABLET | Freq: Four times a day (QID) | ORAL | Status: DC | PRN
  Administered 2023-12-28 (×2): 5 mg via ORAL
  Filled 2023-12-28 (×2): qty 1

## 2023-12-28 MED ORDER — LACTATED RINGERS IV SOLN: INTRAVENOUS | Status: DC

## 2023-12-28 MED ORDER — PROPOFOL 10 MG/ML IV BOLUS
INTRAVENOUS | Status: AC
  Filled 2023-12-28: qty 20

## 2023-12-28 MED ORDER — SUGAMMADEX SODIUM 200 MG/2ML IV SOLN
INTRAVENOUS | Status: DC | PRN
  Administered 2023-12-28: 400 mg via INTRAVENOUS

## 2023-12-28 MED ORDER — ACETAMINOPHEN 10 MG/ML IV SOLN: 1000.0000 mg | Freq: Once | INTRAVENOUS | Status: DC | PRN

## 2023-12-28 MED ORDER — FENTANYL CITRATE (PF) 100 MCG/2ML IJ SOLN
25.0000 ug | INTRAMUSCULAR | Status: DC | PRN
  Administered 2023-12-28 (×3): 50 ug via INTRAVENOUS
  Administered 2023-12-28: 100 ug via INTRAVENOUS

## 2023-12-28 MED ORDER — INDOCYANINE GREEN 25 MG IJ SOLR
2.5000 mg | INTRAMUSCULAR | Status: AC
  Administered 2023-12-28: 2.5 mg via INTRAVENOUS
  Filled 2023-12-28: qty 10

## 2023-12-28 MED ORDER — 0.9 % SODIUM CHLORIDE (POUR BTL) OPTIME
TOPICAL | Status: DC | PRN
  Administered 2023-12-28: 1000 mL

## 2023-12-28 MED ORDER — BUPIVACAINE-EPINEPHRINE 0.25% -1:200000 IJ SOLN
INTRAMUSCULAR | Status: DC | PRN
  Administered 2023-12-28: 30 mL

## 2023-12-28 MED ORDER — BUPIVACAINE-EPINEPHRINE (PF) 0.25% -1:200000 IJ SOLN
INTRAMUSCULAR | Status: AC
  Filled 2023-12-28: qty 30

## 2023-12-28 MED ORDER — ONDANSETRON HCL 4 MG/2ML IJ SOLN: 4.0000 mg | Freq: Once | INTRAMUSCULAR | Status: DC | PRN

## 2023-12-28 MED ORDER — OXYCODONE HCL 5 MG PO TABS: 5.0000 mg | ORAL_TABLET | Freq: Once | ORAL | Status: DC | PRN

## 2023-12-28 SURGICAL SUPPLY — 35 items
BAG COUNTER SPONGE SURGICOUNT (BAG) ×1 IMPLANT
BLADE CLIPPER SURG (BLADE) IMPLANT
CANISTER SUCTION 3000ML PPV (SUCTIONS) ×1 IMPLANT
CHLORAPREP W/TINT 26 (MISCELLANEOUS) ×1 IMPLANT
CLIP LIGATING HEMO O LOK GREEN (MISCELLANEOUS) ×1 IMPLANT
COVER SURGICAL LIGHT HANDLE (MISCELLANEOUS) ×1 IMPLANT
DERMABOND ADVANCED .7 DNX12 (GAUZE/BANDAGES/DRESSINGS) ×1 IMPLANT
ELECTRODE REM PT RTRN 9FT ADLT (ELECTROSURGICAL) ×1 IMPLANT
GLOVE BIOGEL PI IND STRL 7.0 (GLOVE) ×1 IMPLANT
GLOVE SURG SS PI 7.0 STRL IVOR (GLOVE) ×1 IMPLANT
GOWN STRL REUS W/ TWL LRG LVL3 (GOWN DISPOSABLE) ×3 IMPLANT
GRASPER SUT TROCAR 14GX15 (MISCELLANEOUS) ×1 IMPLANT
IRRIGATION SUCT STRKRFLW 2 WTP (MISCELLANEOUS) ×1 IMPLANT
KIT BASIN OR (CUSTOM PROCEDURE TRAY) ×1 IMPLANT
KIT IMAGING PINPOINTPAQ (MISCELLANEOUS) IMPLANT
KIT TURNOVER KIT B (KITS) ×1 IMPLANT
NDL 22X1.5 STRL (OR ONLY) (MISCELLANEOUS) ×1 IMPLANT
NDL HYPO 22X1.5 SAFETY MO (MISCELLANEOUS) ×1 IMPLANT
NEEDLE 22X1.5 STRL (OR ONLY) (MISCELLANEOUS) ×1 IMPLANT
NEEDLE HYPO 22X1.5 SAFETY MO (MISCELLANEOUS) ×1 IMPLANT
PAD ARMBOARD POSITIONER FOAM (MISCELLANEOUS) ×1 IMPLANT
POUCH RETRIEVAL ECOSAC 10 (ENDOMECHANICALS) ×1 IMPLANT
SCISSORS LAP 5X35 DISP (ENDOMECHANICALS) ×1 IMPLANT
SET TUBE SMOKE EVAC HIGH FLOW (TUBING) ×1 IMPLANT
SLEEVE Z-THREAD 5X100MM (TROCAR) ×2 IMPLANT
SOLN 0.9% NACL POUR BTL 1000ML (IV SOLUTION) ×1 IMPLANT
SOLN STERILE WATER BTL 1000 ML (IV SOLUTION) ×1 IMPLANT
SUT MNCRL AB 4-0 PS2 18 (SUTURE) ×1 IMPLANT
SUT VICRYL 0 UR6 27IN ABS (SUTURE) IMPLANT
TOWEL GREEN STERILE (TOWEL DISPOSABLE) ×1 IMPLANT
TOWEL GREEN STERILE FF (TOWEL DISPOSABLE) ×1 IMPLANT
TRAY LAPAROSCOPIC MC (CUSTOM PROCEDURE TRAY) ×1 IMPLANT
TROCAR Z THREAD OPTICAL 12X100 (TROCAR) ×1 IMPLANT
TROCAR Z-THREAD OPTICAL 5X100M (TROCAR) ×1 IMPLANT
WARMER LAPAROSCOPE (MISCELLANEOUS) ×1 IMPLANT

## 2023-12-28 NOTE — Progress Notes (Signed)
 Patient ID: Erin REKOWSKI, female   DOB: 1965/04/13, 58 y.o.   MRN: [id number]  Brief GI note   Patient status post ERCP, sphincterotomy and stone/sludge extraction yesterday.  Tolerated well  She is now status post laparoscopic cholecystectomy this morning with finding of chronic calculous cholecystitis  WBC 7.7-down from 18.3 yesterday Hemoglobin 9.2/hematocrit 28.1 BUN 8/creatinine 0.74 Lipase 146  T. bili 1.2/alk phos 118/AST 50/ALT 96 much improved   Plan; GI will sign off, available if needed, please call for questions or concerns

## 2023-12-28 NOTE — Plan of Care (Signed)

## 2023-12-28 NOTE — Op Note (Signed)
 PATIENT:  Erin Singh  58 y.o. female  PRE-OPERATIVE DIAGNOSIS:  biliary pancreatitis  POST-OPERATIVE DIAGNOSIS:  biliary pancreatitis  PROCEDURE:  Procedure(s): LAPAROSCOPIC CHOLECYSTECTOMY INDOCYANINE GREEN  FLUORESCENCE IMAGING (ICG)   SURGEON:  Jalaya Sarver, Herlene Righter, MD   ASSISTANT: none  ANESTHESIA:   local and general  Indications for procedure: Erin Singh is a 58 y.o. female with symptoms of Abdominal pain and Nausea and vomiting consistent with gallbladder disease, Confirmed by MRI.  Description of procedure: The patient was brought into the operative suite, placed supine. Anesthesia was administered with endotracheal tube. Patient was strapped in place and foot board was secured. All pressure points were offloaded by foam padding. The patient was prepped and draped in the usual sterile fashion.  A periumbilical incision was made and optical entry was used to enter the abdomen. 2 5 mm trocars were placed on in the right lateral space on in the right subcostal space. A 12mm trocar was placed in the subxiphoid space. Marcaine  was infused to the subxiphoid space and lateral upper right abdomen in the transversus abdominis plane. Next the patient was placed in reverse trendelenberg. The gallbladder appearedchronically inflamed and there was a large amount of adhesive disease to the omentum and liver that was bluntly dissected in order to visualize the gallbladder. In addition the left lobe of the liver was very long making visualization difficult  The gallbladder was retracted cephalad and lateral. The peritoneum was reflected off the infundibulum working lateral to medial. The cystic duct and cystic artery were identified and further dissection revealed a critical view. The cystic duct and cystic artery were doubly clipped and ligated.   The gallbladder was removed off the liver bed with cautery. The Gallbladder was placed in a specimen bag. The gallbladder fossa was irrigated and  hemostasis was applied with cautery. The gallbladder was removed via the 12mm trocar. The fascial defect was closed with interrupted 0 vicryl suture via laparoscopic trans-fascial suture passer and loose stones were removed. Pneumoperitoneum was removed, all trocar were removed. All incisions were closed with 4-0 monocryl subcuticular stitch. The patient woke from anesthesia and was brought to PACU in stable condition. All counts were correct  Findings: chronic calculous cholecystitis  Specimen: gallbladder  Blood loss: 30 ml  Local anesthesia: 30 ml Marcaine   Complications: none  PLAN OF CARE: Admit to inpatient   PATIENT DISPOSITION:  PACU - hemodynamically stable.  Herlene Righter [facility], [place]

## 2023-12-28 NOTE — Discharge Instructions (Signed)

## 2023-12-28 NOTE — Anesthesia Postprocedure Evaluation (Signed)
 Anesthesia Post Note  Patient: ALLYSE FREGEAU  Procedure(s) Performed: LAPAROSCOPIC CHOLECYSTECTOMY INDOCYANINE GREEN  FLUORESCENCE IMAGING (ICG)     Patient location during evaluation: PACU Anesthesia Type: General Level of consciousness: awake and alert Pain management: pain level controlled Vital Signs Assessment: post-procedure vital signs reviewed and stable Respiratory status: spontaneous breathing, nonlabored ventilation, respiratory function stable and patient connected to nasal cannula oxygen Cardiovascular status: blood pressure returned to baseline and stable Postop Assessment: no apparent nausea or vomiting Anesthetic complications: no   No notable events documented.  Last Vitals:  Vitals:   12/28/23 1205 12/28/23 1602  BP:    Pulse:    Resp:    Temp: 36.6 C 36.7 C  SpO2:      Last Pain:  Vitals:   12/28/23 1602  TempSrc: Oral  PainSc:                  Lynwood MARLA Cornea

## 2023-12-28 NOTE — Anesthesia Procedure Notes (Signed)
 Procedure Name: Intubation Date/Time: 12/28/2023 8:03 AM  Performed by: Virgil Ee, CRNAPre-anesthesia Checklist: Patient identified, Patient being monitored, Timeout performed, Emergency Drugs available and Suction available Patient Re-evaluated:Patient Re-evaluated prior to induction Oxygen Delivery Method: Circle system utilized Preoxygenation: Pre-oxygenation with 100% oxygen Induction Type: IV induction Ventilation: Mask ventilation without difficulty Laryngoscope Size: Mac and 3 Grade View: Grade I Tube type: Oral Tube size: 6.5 mm Number of attempts: 1 Airway Equipment and Method: Stylet Placement Confirmation: ETT inserted through vocal cords under direct vision, positive ETCO2 and breath sounds checked- equal and bilateral Secured at: 20 cm Tube secured with: Tape Dental Injury: Teeth and Oropharynx as per pre-operative assessment

## 2023-12-28 NOTE — Transfer of Care (Signed)
 Immediate Anesthesia Transfer of Care Note  Patient: Erin Singh  Procedure(s) Performed: LAPAROSCOPIC CHOLECYSTECTOMY INDOCYANINE GREEN  FLUORESCENCE IMAGING (ICG)  Patient Location: PACU  Anesthesia Type:General  Level of Consciousness: drowsy and responds to stimulation  Airway & Oxygen Therapy: Patient Spontanous Breathing  Post-op Assessment: Report given to RN and Post -op Vital signs reviewed and stable  Post vital signs: Reviewed and stable  Last Vitals:  Vitals Value Taken Time  BP 134/88 12/28/23 09:34  Temp    Pulse 96 12/28/23 09:38  Resp 18 12/28/23 09:38  SpO2 94 % 12/28/23 09:38  Vitals shown include unfiled device data.  Last Pain:  Vitals:   12/28/23 0725  TempSrc: Oral  PainSc: 0-No pain         Complications: No notable events documented.

## 2023-12-28 NOTE — Progress Notes (Addendum)
 NAME:  Erin Singh, MRN:  [id number], DOB:  [birth year], LOS: 1 ADMISSION DATE:  12/27/2023, CONSULTATION DATE:  12/28/23 REFERRING MD:  EDP, CHIEF COMPLAINT:  septic shock    History of Present Illness:  Erin Singh 58 year old female presented to [facility] emergency department with 1 day of abdominal pain nausea and vomiting.  No noted fevers, contacts with similar symptoms.  No hematemesis.  Workup was significant for lipase of 2200 AST 238, ALT 253 total bili 2.9, WBCs 14.9.  CT abdomen/pelvis with acute pancreatitis and gallbladder wall thickening with biliary ductal dilation.  Right upper quadrant ultrasound showed cholelithiasis with marked gallbladder wall thickening consistent with acute cholecystitis.  MRCP also showed cholelithiasis with 8 mm CBD calculus GI and patient was recommended for and emergent ERCP, therefore was transferred to [facility] for this procedure.  During ED course patient's blood pressure began dropping, received 2 L IV fluid, Zosyn  and started on norepinephrine  and PCCM was consulted for admission  Arrives to [facility], pain is a bit better, she is anxious. Nausea controlled.  Has been having GB symptoms for a few months trying to modify diet.  Pertinent  Medical History   has a past medical history of Fatigue and Wears glasses.   Significant Hospital Events: Including procedures, antibiotic start and stop dates in addition to other pertinent events   12/5 presented to [facility] ED, admitted to [facility] for ERCP  Interim History / Subjective:  S/p lap chole on 12/7.  Postop no issues.  Hemodynamically stable, conversational, not on vasopressors Will downgrade to medicine  Objective    Blood pressure (!) 133/94, pulse 87, temperature 97.9 F (36.6 C), temperature source Oral, resp. rate 16, height 5' 4 (1.626 m), weight 69.9 kg, last menstrual period 01/31/2014, SpO2 95%.        Intake/Output Summary (Last 24 hours) at 12/28/2023 0802 Last data filed at 12/28/2023  0631 Gross per 24 hour  Intake 3391.29 ml  Output 1000 ml  Net 2391.29 ml   Filed Weights   12/27/23 1219  Weight: 69.9 kg     General: Lying in bed, not in distress HEENT: NCAT Respiratory: Symmetrical chest wall movement CVS: S1-S2 normal no murmur Abdomen: Soft, appropriate tenderness to palpation, clean dry incisional wound. Extremities: Warm, no edema Neuro: AO X3, move all extremities GU: Deferred   Resolved problem list   Assessment and Plan  58 year old female presents to [facility] with abdominal pain nausea and vomiting, and in septic shock/2/acute pancreatitis, and ascending cholangitis from obstructive stone.  Patient was emergently taken to ERCP on 12/6 for biliary sphincterectomy  Septic shock due to ascending cholangitis-improved Gallstone pancreatitis -s/p ERCP-sphincterectomy 12/6, s/p lap chole on 12/7 Ascending cholangitis-improved Abdominal pain-improved   Plan - Pain management - Serial labs - Continue antibiotics Zosyn  - Monitor urinary output - Surgery team on board - Hemodynamically stable, off vasopressors, conversational. - Stable to be downgraded to the medical floor   Labs   CBC: Recent Labs  Lab 12/26/23 1624 12/27/23 0319 12/28/23 0356  WBC 14.9* 18.3* 7.7  HGB 12.5 11.2* 9.2*  HCT 38.0 33.9* 28.1*  MCV 85.0 85.2 86.2  PLT 228 219 151    Basic Metabolic Panel: Recent Labs  Lab 12/26/23 1624 12/27/23 0319 12/28/23 0356  NA 141 138 140  K 3.7 3.3* 4.1  CL 99 103 104  CO2 26 26 23   GLUCOSE 114* 109* 109*  BUN 12 13 8   CREATININE 0.97 1.11* 0.74  CALCIUM 9.5 8.2*  8.6*  MG  --  1.8 2.2  PHOS  --  4.2 2.4*   GFR: Estimated Creatinine Clearance: 75.4 mL/min (by C-G formula based on SCr of 0.74 mg/dL). Recent Labs  Lab 12/26/23 1624 12/26/23 2338 12/27/23 0319 12/28/23 0356  WBC 14.9*  --  18.3* 7.7  LATICACIDVEN  --  1.6  --   --     Liver Function Tests: Recent Labs  Lab 12/26/23 1624 12/27/23 0319  12/28/23 0356  AST 238* 122* 50*  ALT 253* 163* 96*  ALKPHOS 250* 162* 118  BILITOT 2.9* 2.2* 1.2  PROT 8.3* 7.0 6.0*  ALBUMIN 4.5 3.1* 2.5*   Recent Labs  Lab 12/26/23 1624 12/27/23 0417 12/28/23 0356  LIPASE 2,212* 365* 146*   No results for input(s): AMMONIA in the last 168 hours.  ABG    Component Value Date/Time   TCO2 23 06/16/2009 0928     Coagulation Profile: Recent Labs  Lab 12/27/23 0742  INR 1.3*    Cardiac Enzymes: No results for input(s): CKTOTAL, CKMB, CKMBINDEX, TROPONINI in the last 168 hours.  HbA1C: No results found for: HGBA1C  CBG: Recent Labs  Lab 12/27/23 0236  GLUCAP 100*    Review of Systems:   Please see the history of present illness. All other systems reviewed and are negative    Past Medical History:  She,  has a past medical history of Fatigue and Wears glasses.   Surgical History:   Past Surgical History:  Procedure Laterality Date   ERCP  12/27/2023     Social History:   reports that she has never smoked. She has never used smokeless tobacco. She reports current alcohol use. She reports that she does not use drugs.   Family History:  Her family history includes Breast cancer in her maternal aunt; Diabetes in her mother; Kidney failure in her mother; Other in her mother.   Allergies Allergies  Allergen Reactions   Bee Pollen Other (See Comments)    Uncontrollable sneezing     Home Medications  Prior to Admission medications   Medication Sig Start Date End Date Taking? Authorizing Provider  cyclobenzaprine  (FLEXERIL ) 5 MG tablet Take 1 tablet (5 mg total) by mouth 3 (three) times daily as needed. 11/27/22   Menshew, Candida LULLA Kings, PA-C  ondansetron  (ZOFRAN -ODT) 4 MG disintegrating tablet Take 1 tablet (4 mg total) by mouth every 8 (eight) hours as needed for nausea or vomiting. 10/19/23   Herlinda Belton B, FNP     Critical care time: 35 min cc time      CRITICAL CARE  Lenny Drought,  MD  [facility] Prefer epic messenger for cross cover needs  Critical care time was exclusive of separately billable procedures and treating other patients.  Critical care was necessary to treat or prevent imminent or life-threatening deterioration.  Critical care was time spent personally by me on the following activities: development of treatment plan with patient and/or surrogate as well as nursing, discussions with consultants, evaluation of patient's response to treatment, examination of patient, obtaining history from patient or surrogate, ordering and performing treatments and interventions, ordering and review of laboratory studies, ordering and review of radiographic studies, pulse oximetry, re-evaluation of patient's condition and participation in multidisciplinary rounds.

## 2023-12-28 NOTE — Anesthesia Preprocedure Evaluation (Addendum)
 Anesthesia Evaluation  Patient identified by MRN, date of birth, ID band Patient awake    Reviewed: Allergy & Precautions, NPO status , Patient's Chart, lab work & pertinent test results, reviewed documented beta blocker date and time   History of Anesthesia Complications Negative for: history of anesthetic complications  Airway Mallampati: III  TM Distance: >3 FB Neck ROM: Full    Dental no notable dental hx.    Pulmonary neg COPD   breath sounds clear to auscultation       Cardiovascular negative cardio ROS  Rhythm:Regular Rate:Normal     Neuro/Psych neg Seizures    GI/Hepatic ,neg GERD  ,,(+) neg Cirrhosis      Cholecystitis - previously requiring levophed    Endo/Other    Renal/GU Renal disease     Musculoskeletal   Abdominal   Peds  Hematology   Anesthesia Other Findings   Reproductive/Obstetrics                              Anesthesia Physical Anesthesia Plan  ASA: 2  Anesthesia Plan: General   Post-op Pain Management:    Induction: Intravenous  PONV Risk Score and Plan: 2 and Ondansetron  and Dexamethasone   Airway Management Planned: Oral ETT  Additional Equipment:   Intra-op Plan:   Post-operative Plan: Extubation in OR  Informed Consent: I have reviewed the patients History and Physical, chart, labs and discussed the procedure including the risks, benefits and alternatives for the proposed anesthesia with the patient or authorized representative who has indicated his/her understanding and acceptance.     Dental advisory given  Plan Discussed with: CRNA  Anesthesia Plan Comments:          Anesthesia Quick Evaluation

## 2023-12-28 NOTE — Progress Notes (Signed)
 Pre Procedure note for inpatients:   Erin Singh has been scheduled for Procedure(s): LAPAROSCOPIC CHOLECYSTECTOMY (N/A) INDOCYANINE GREEN  FLUORESCENCE IMAGING (ICG) (N/A) today. The various methods of treatment have been discussed with the patient. After consideration of the risks, benefits and treatment options the patient has consented to the planned procedure.   The patient has been seen and labs reviewed. There are no changes in the patient's condition to prevent proceeding with the planned procedure today.  Recent labs:  Lab Results  Component Value Date   WBC 7.7 12/28/2023   HGB 9.2 (L) 12/28/2023   HCT 28.1 (L) 12/28/2023   PLT 151 12/28/2023   GLUCOSE 109 (H) 12/28/2023   ALT 96 (H) 12/28/2023   AST 50 (H) 12/28/2023   NA 140 12/28/2023   K 4.1 12/28/2023   CL 104 12/28/2023   CREATININE 0.74 12/28/2023   BUN 8 12/28/2023   CO2 23 12/28/2023   INR 1.3 (H) 12/27/2023    [name], MD 12/28/2023 7:24 AM

## 2023-12-29 ENCOUNTER — Encounter (HOSPITAL_COMMUNITY): Payer: Self-pay | Admitting: General Surgery

## 2023-12-29 DIAGNOSIS — K8309 Other cholangitis: Secondary | ICD-10-CM

## 2023-12-29 LAB — CBC
HCT: 28.2 % — ABNORMAL LOW (ref 36.0–46.0)
Hemoglobin: 9.2 g/dL — ABNORMAL LOW (ref 12.0–15.0)
MCH: 28.2 pg (ref 26.0–34.0)
MCHC: 32.6 g/dL (ref 30.0–36.0)
MCV: 86.5 fL (ref 80.0–100.0)
Platelets: 204 K/uL (ref 150–400)
RBC: 3.26 MIL/uL — ABNORMAL LOW (ref 3.87–5.11)
RDW: 13.8 % (ref 11.5–15.5)
WBC: 11.1 K/uL — ABNORMAL HIGH (ref 4.0–10.5)
nRBC: 0 % (ref 0.0–0.2)

## 2023-12-29 LAB — BASIC METABOLIC PANEL WITH GFR
Anion gap: 9 (ref 5–15)
BUN: 10 mg/dL (ref 6–20)
CO2: 24 mmol/L (ref 22–32)
Calcium: 8.2 mg/dL — ABNORMAL LOW (ref 8.9–10.3)
Chloride: 106 mmol/L (ref 98–111)
Creatinine, Ser: 0.73 mg/dL (ref 0.44–1.00)
GFR, Estimated: >60 mL/min (ref >60)
Glucose, Bld: 96 mg/dL (ref 70–99)
Potassium: 3.7 mmol/L (ref 3.5–5.1)
Sodium: 139 mmol/L (ref 135–145)

## 2023-12-29 LAB — HEPATIC FUNCTION PANEL
ALT: 81 U/L — ABNORMAL HIGH (ref 0–44)
AST: 40 U/L (ref 15–41)
Albumin: 2.6 g/dL — ABNORMAL LOW (ref 3.5–5.0)
Alkaline Phosphatase: 121 U/L (ref 38–126)
Bilirubin, Direct: 0.2 mg/dL (ref 0.0–0.2)
Indirect Bilirubin: 0.7 mg/dL (ref 0.3–0.9)
Total Bilirubin: 0.9 mg/dL (ref 0.0–1.2)
Total Protein: 6.3 g/dL — ABNORMAL LOW (ref 6.5–8.1)

## 2023-12-29 LAB — MAGNESIUM: Magnesium: 2 mg/dL (ref 1.7–2.4)

## 2023-12-29 LAB — PHOSPHORUS: Phosphorus: 2.3 mg/dL — ABNORMAL LOW (ref 2.5–4.6)

## 2023-12-29 MED ORDER — PIPERACILLIN-TAZOBACTAM 3.375 G IVPB
3.3750 g | Freq: Three times a day (TID) | INTRAVENOUS | Status: DC
  Administered 2023-12-29 – 2023-12-30 (×3): 3.375 g via INTRAVENOUS
  Filled 2023-12-29 (×3): qty 50

## 2023-12-29 MED ORDER — POTASSIUM & SODIUM PHOSPHATES 280-160-250 MG PO PACK
1.0000 | PACK | Freq: Three times a day (TID) | ORAL | Status: DC
  Administered 2023-12-29 – 2023-12-30 (×2): 1 via ORAL
  Filled 2023-12-29 (×5): qty 1

## 2023-12-29 MED ORDER — ACETAMINOPHEN 325 MG PO TABS: 650.0000 mg | ORAL_TABLET | Freq: Four times a day (QID) | ORAL | Status: DC | PRN

## 2023-12-29 MED ORDER — PANTOPRAZOLE SODIUM 40 MG IV SOLR
40.0000 mg | INTRAVENOUS | Status: DC
  Administered 2023-12-29: 40 mg via INTRAVENOUS
  Filled 2023-12-29: qty 10

## 2023-12-29 NOTE — Progress Notes (Signed)
 Progress Note   Patient: Erin Singh:[id number] DOB: 1965-12-03 DOA: 12/27/2023     2 DOS: the patient was seen and examined on 12/29/2023    Brief hospital course: Catlynn Grondahl is a 58 y o woman with no significant PMH who presented to [facility] emergency department with 1 day of abdominal pain nausea and vomiting. No noted fevers, contacts with similar symptoms. No hematemesis. Workup was significant for lipase of 2200 AST 238, ALT 253 total bili 2.9, WBCs 14.9. CT abdomen/pelvis with acute pancreatitis and gallbladder wall thickening with biliary ductal dilation. Right upper quadrant ultrasound showed cholelithiasis with marked gallbladder wall thickening consistent with acute cholecystitis. MRCP also showed cholelithiasis with 8 mm CBD calculus.  GI was consulted and emergent ERCP was recommended. Pt was transferred to [facility] for this procedure. During ED course patient's blood pressure began dropping, received 2 L IV fluid, Zosyn  and started on norepinephrine  and PCCM was consulted for admission.   Patient  underwent ERCP on 12/6.  The entire main bile duct was noted to be mildly dilated.  Biliary tree sphincterotomy was performed and biliary tree was swept.  Sludge was found.  Surgery was consulted for cholecystectomy. Patient had a cholecystectomy on 12/7. Postoperatively, patient noted to be hemodynamically stable and was transferred to the medical surgical floor.    Assessment and Plan:  Septic shock Due to acute cholecystitis Patient required ICU admission with pressors. Sepsis is resolved. - Will continue antibiotics.  Acute cholecystitis with concern for cholangitis Started on Zosyn  on admission.  Underwent cholecystectomy on 12/7. Zosyn  was discontinued after cholecystectomy. Patient complains of pain, has low-grade fever. Transaminases trending down. - Resume zosyn .   Acute pancreatitis (Gallstone pancreatitis) Presented with lipase of 2200. Now s/p  cholecystectomy. Patient is improving clinically. Tolerating regular diet. - Will monitor overnight.  Gastritis ERCP revealed patchy mildly erythematous mucosa without bleeding in the entire examined stomach.  Biopsies were taken.  - DC NSAIDs - Tylenol  for mild pain.  - Start protonix  -Follow up biopsy results as outpatient.       Subjective: Patient complains of abdominal pain with activity. She has had a bowel movement. She admits to chills.  Physical Exam: BP 113/73   Pulse 63   Temp 98 F (36.7 C) (Oral)   Resp 13   Ht 5' 4 (1.626 m)   Wt 68.4 kg   LMP 01/31/2014   SpO2 95%   BMI 25.88 kg/m   Physical Exam   General: Alert, oriented X3  Eyes: Pupils equal, reactive  Oral cavity: moist mucous membranes  Head: Atraumatic, normocephalic  Neck: supple  Chest: clear to auscultation. No crackles, no wheezes  CVS: S1,S2 RRR. No murmurs  Abd: Laparotomy wounds clean, dry, intact Extr: No edema   MSK: No joint deformities or swelling  Neurological: Grossly intact.    Data Reviewed:    Latest Ref Rng & Units 12/29/2023    4:34 AM 12/28/2023    3:56 AM 12/27/2023    3:19 AM  CBC  WBC 4.0 - 10.5 K/uL 11.1  7.7  18.3   Hemoglobin 12.0 - 15.0 g/dL 9.2  9.2  88.7   Hematocrit 36.0 - 46.0 % 28.2  28.1  33.9   Platelets 150 - 400 K/uL 204  151  219       Latest Ref Rng & Units 12/29/2023    4:34 AM 12/28/2023    3:56 AM 12/27/2023    3:19 AM  BMP  Glucose 70 -  99 mg/dL 96  890  890   BUN 6 - 20 mg/dL 10  8  13    Creatinine 0.44 - 1.00 mg/dL 9.26  9.25  8.88   Sodium 135 - 145 mmol/L 139  140  138   Potassium 3.5 - 5.1 mmol/L 3.7  4.1  3.3   Chloride 98 - 111 mmol/L 106  104  103   CO2 22 - 32 mmol/L 24  23  26    Calcium 8.9 - 10.3 mg/dL 8.2  8.6  8.2      Family Communication: n/a  Disposition: Status is: Inpatient Remains inpatient appropriate because: Continues on IV antibiotics      Author: MDALA-GAUSI, Maiana Hennigan AGATHA, MD 12/29/2023 11:56  AM  For on call review [email or web address].

## 2023-12-29 NOTE — Plan of Care (Signed)
   Problem: Education: Goal: Knowledge of General Education information will improve Description Including pain rating scale, medication(s)/side effects and non-pharmacologic comfort measures Outcome: Progressing   Problem: Health Behavior/Discharge Planning: Goal: Ability to manage health-related needs will improve Outcome: Progressing

## 2023-12-29 NOTE — Progress Notes (Signed)
 Progress Note  1 Day Post-Op  Subjective: Patient tolerating regular diet. Had minimal nausea but has resolved. Denies vomiting. Had BM this morning and flatulence.   ROS  All negative with the exception of above.  Objective: Vital signs in last 24 hours: Temp:  [97.8 F (36.6 C)-99 F (37.2 C)] 97.8 F (36.6 C) (12/08 0330) Pulse Rate:  [62-96] 62 (12/08 0330) Resp:  [13-28] 16 (12/08 0330) BP: (105-141)/(64-98) 129/81 (12/08 0330) SpO2:  [93 %-98 %] 98 % (12/08 0330) Weight:  [68.4 kg] 68.4 kg (12/08 0349) Last BM Date : 12/26/23  Intake/Output from previous day: 12/07 0701 - 12/08 0700 In: 1650 [P.O.:600; I.V.:1000; IV Piggyback:50] Out: 20 [Blood:20] Intake/Output this shift: No intake/output data recorded.  PE: General: Pleasant female who is laying in bed in NAD. HEENT: Head is normocephalic, atraumatic. Sclera are noninjected. EOMI. Heart: HR normal during encounter.  Lungs: Respiratory effort nonlabored. Abd: Soft with mild distention. Generalized tenderness to palpation. Incisions C/D/I with dermabond. No rebound tenderness or guarding.  MS: Able to move all 4 extremities.  Skin: Warm and dry.  Psych: A&Ox3 with an appropriate affect.    Lab Results:  Recent Labs    12/28/23 0356 12/29/23 0434  WBC 7.7 11.1*  HGB 9.2* 9.2*  HCT 28.1* 28.2*  PLT 151 204   BMET Recent Labs    12/28/23 0356 12/29/23 0434  NA 140 139  K 4.1 3.7  CL 104 106  CO2 23 24  GLUCOSE 109* 96  BUN 8 10  CREATININE 0.74 0.73  CALCIUM 8.6* 8.2*   PT/INR Recent Labs    12/27/23 0742  LABPROT 17.0*  INR 1.3*   CMP     Component Value Date/Time   NA 139 12/29/2023 0434   K 3.7 12/29/2023 0434   CL 106 12/29/2023 0434   CO2 24 12/29/2023 0434   GLUCOSE 96 12/29/2023 0434   BUN 10 12/29/2023 0434   CREATININE 0.73 12/29/2023 0434   CALCIUM 8.2 (L) 12/29/2023 0434   PROT 6.3 (L) 12/29/2023 0434   ALBUMIN 2.6 (L) 12/29/2023 0434   AST 40 12/29/2023 0434    ALT 81 (H) 12/29/2023 0434   ALKPHOS 121 12/29/2023 0434   BILITOT 0.9 12/29/2023 0434   GFRNONAA >60 12/29/2023 0434   GFRAA >60 11/20/2014 2330   Lipase     Component Value Date/Time   LIPASE 146 (H) 12/28/2023 0356       Studies/Results: DG ERCP Result Date: 12/28/2023 CLINICAL DATA:  [id number] Surgery, elective [id number] EXAM: ERCP COMPARISON:  CT AP, 12/26/2023.  MRCP, 12/26/2023. FLUOROSCOPY: Exposure Index (as provided by the fluoroscopic device): 27.8 mGy Kerma FINDINGS: Limited oblique planar images of the RIGHT upper quadrant obtained C-arm. Images demonstrating flexible endoscopy, biliary duct cannulation, sphincterotomy, retrograde cholangiogram and balloon sweep. No biliary ductal dilation. No evidence of biliary filling defect is demonstrated. IMPRESSION: Fluoroscopic imaging for ERCP For complete description of intra procedural findings, please see performing service dictation. Electronically Signed   By: Thom Hall M.D.   On: 12/28/2023 11:23    Anti-infectives: Anti-infectives (From admission, onward)    Start     Dose/Rate Route Frequency Ordered Stop   12/27/23 0400  piperacillin -tazobactam (ZOSYN ) IVPB 3.375 g  Status:  Discontinued        3.375 g 12.5 mL/hr over 240 Minutes Intravenous Every 8 hours 12/27/23 0255 12/28/23 1056        Assessment/Plan POD1: S/P LAPAROSCOPIC CHOLECYSTECTOMY, INDOCYANINE GREEN  FLUORESCENCE IMAGING by Dr. Stevie  12/28/2023 -ERCP on 12/6 -Afebrile -WBC 11.1 from 7.7 likely reactive from surgery; HGB stable 9.2 from 9.2 -Pain well controlled. Tolerating regular diet and having bowel function. Minimal nausea. No vomiting. -Discussed post-operative expectations and restrictions in great detail. Will provide work physicist, medical. Follow up communication has been sent. Patient is aware to call and confirm her outpatient follow up visit. All questions were answered.  FEN: Regular; IVF per primary team VTE: Lovenox  ID: None currently    LOS:  2 days   I reviewed hospitalist notes, specialist notes, nursing notes, last 24 h vitals and pain scores, last 48 h intake and output, last 24 h labs and trends, and last 24 h imaging results.   Erin Singh, [facility] 12/29/2023, 9:27 AM Please see Amion for pager number during day hours 7:00am-4:30pm

## 2023-12-29 NOTE — Plan of Care (Signed)
 Pt transferred to unit at midnight.  VSS on RA, no complaints of pain. Assessment performed and charted. No stool yet, but pt is passing gas. Patient was oriented to room. Will continue to monitor Problem: Education: Goal: Knowledge of General Education information will improve Description: Including pain rating scale, medication(s)/side effects and non-pharmacologic comfort measures Outcome: Progressing   Problem: Clinical Measurements: Goal: Ability to maintain clinical measurements within normal limits will improve Outcome: Progressing   Problem: Activity: Goal: Risk for activity intolerance will decrease Outcome: Progressing   Problem: Elimination: Goal: Will not experience complications related to bowel motility Outcome: Progressing   Problem: Skin Integrity: Goal: Risk for impaired skin integrity will decrease Outcome: Progressing

## 2023-12-30 ENCOUNTER — Other Ambulatory Visit (HOSPITAL_COMMUNITY): Payer: Self-pay

## 2023-12-30 DIAGNOSIS — K8309 Other cholangitis: Secondary | ICD-10-CM | POA: Diagnosis not present

## 2023-12-30 LAB — BASIC METABOLIC PANEL WITH GFR
Anion gap: 15 (ref 5–15)
BUN: <5 mg/dL — ABNORMAL LOW (ref 6–20)
CO2: 18 mmol/L — ABNORMAL LOW (ref 22–32)
Calcium: 8.1 mg/dL — ABNORMAL LOW (ref 8.9–10.3)
Chloride: 104 mmol/L (ref 98–111)
Creatinine, Ser: 0.84 mg/dL (ref 0.44–1.00)
GFR, Estimated: >60 mL/min (ref >60)
Glucose, Bld: 84 mg/dL (ref 70–99)
Potassium: 3.4 mmol/L — ABNORMAL LOW (ref 3.5–5.1)
Sodium: 137 mmol/L (ref 135–145)

## 2023-12-30 LAB — CBC
HCT: 29.9 % — ABNORMAL LOW (ref 36.0–46.0)
Hemoglobin: 9.9 g/dL — ABNORMAL LOW (ref 12.0–15.0)
MCH: 28.5 pg (ref 26.0–34.0)
MCHC: 33.1 g/dL (ref 30.0–36.0)
MCV: 86.2 fL (ref 80.0–100.0)
Platelets: 212 K/uL (ref 150–400)
RBC: 3.47 MIL/uL — ABNORMAL LOW (ref 3.87–5.11)
RDW: 13.5 % (ref 11.5–15.5)
WBC: 6.5 K/uL (ref 4.0–10.5)
nRBC: 0 % (ref 0.0–0.2)

## 2023-12-30 LAB — HEPATIC FUNCTION PANEL
ALT: 63 U/L — ABNORMAL HIGH (ref 0–44)
AST: 29 U/L (ref 15–41)
Albumin: 2.8 g/dL — ABNORMAL LOW (ref 3.5–5.0)
Alkaline Phosphatase: 110 U/L (ref 38–126)
Bilirubin, Direct: 0.3 mg/dL — ABNORMAL HIGH (ref 0.0–0.2)
Indirect Bilirubin: 0.7 mg/dL (ref 0.3–0.9)
Total Bilirubin: 1 mg/dL (ref 0.0–1.2)
Total Protein: 6.7 g/dL (ref 6.5–8.1)

## 2023-12-30 LAB — PHOSPHORUS: Phosphorus: 2.2 mg/dL — ABNORMAL LOW (ref 2.5–4.6)

## 2023-12-30 LAB — SURGICAL PATHOLOGY

## 2023-12-30 LAB — MAGNESIUM: Magnesium: 1.9 mg/dL (ref 1.7–2.4)

## 2023-12-30 MED ORDER — POTASSIUM CHLORIDE CRYS ER 20 MEQ PO TBCR
40.0000 meq | EXTENDED_RELEASE_TABLET | Freq: Once | ORAL | Status: AC
  Administered 2023-12-30: 40 meq via ORAL
  Filled 2023-12-30: qty 2

## 2023-12-30 MED ORDER — K PHOS MONO-SOD PHOS DI & MONO 155-852-130 MG PO TABS
500.0000 mg | ORAL_TABLET | Freq: Two times a day (BID) | ORAL | 0 refills | Status: AC
  Filled 2023-12-30: qty 12, 3d supply, fill #0

## 2023-12-30 MED ORDER — PANTOPRAZOLE SODIUM 40 MG PO TBEC
40.0000 mg | DELAYED_RELEASE_TABLET | Freq: Every day | ORAL | 1 refills | Status: AC
  Filled 2023-12-30: qty 30, 30d supply, fill #0

## 2023-12-30 MED ORDER — CIPROFLOXACIN HCL 500 MG PO TABS
500.0000 mg | ORAL_TABLET | Freq: Two times a day (BID) | ORAL | 0 refills | Status: AC
  Filled 2023-12-30: qty 8, 4d supply, fill #0

## 2023-12-30 NOTE — Discharge Summary (Signed)
 Physician Discharge Summary   Patient: Erin Singh MRN: [id number] DOB: 06/01/1965  Admit date:     12/27/2023  Discharge date: 12/30/23  Discharge Physician: MDALA-GAUSI, GOLDEN PILLOW   PCP: System, Provider Not In   Recommendations at discharge:   Follow-up with general surgery  Discharge Diagnoses: Principal Problem:   Cholangitis (HCC) Active Problems:   Gastritis without bleeding   Choledocholithiasis   Cholecystitis, acute  Resolved Problems:   * No resolved hospital problems. *  Hospital Course: Erin Singh is a 58 y o woman with no significant PMH who presented to [facility] emergency department with 1 day of abdominal pain nausea and vomiting. No noted fevers, contacts with similar symptoms. No hematemesis. Workup was significant for lipase of 2200 AST 238, ALT 253 total bili 2.9, WBCs 14.9. CT abdomen/pelvis with acute pancreatitis and gallbladder wall thickening with biliary ductal dilation. Right upper quadrant ultrasound showed cholelithiasis with marked gallbladder wall thickening consistent with acute cholecystitis. MRCP also showed cholelithiasis with 8 mm CBD calculus.   GI was consulted and emergent ERCP was recommended. Pt was transferred to [facility] for this procedure. During ED course patient's blood pressure began dropping, received 2 L IV fluid, Zosyn  and started on norepinephrine  and PCCM was consulted for admission.    Patient  underwent ERCP on 12/6.  The entire main bile duct was noted to be mildly dilated.  Biliary tree sphincterotomy was performed and biliary tree was swept.  Sludge was found.  Surgery was consulted for cholecystectomy. Patient had a cholecystectomy on 12/7. Postoperatively, patient noted to be hemodynamically stable and was transferred to the medical surgical floor.    The rest of the hospital course is in problem-based format below:    Septic shock Due to acute cholecystitis Patient required ICU admission with pressors. Sepsis  resolved.   Acute cholecystitis with concern for cholangitis Started on Zosyn  on admission.  Underwent cholecystectomy on 12/7. Zosyn  was discontinued after cholecystectomy, but resumed on 12/8. Transaminases trending down. Patient discharged home on ciprofloxacin .    Acute pancreatitis (Gallstone pancreatitis) Presented with lipase of 2200. Now s/p cholecystectomy. Patient is improving clinically. Tolerating regular diet.   Gastritis ERCP revealed patchy mildly erythematous mucosa without bleeding in the entire examined stomach.  Biopsies were taken.   NSAIDs discontinued.  Tylenol  for mild pain.  Started on protonix  Patient to follow up biopsy results as outpatient.        Consultants: Surgery, gastroenterology Procedures performed: ERCP, cholecystectomy Disposition: Home Diet recommendation:  Discharge Diet Orders (From admission, onward)     Start     Ordered   12/30/23 0000  Diet - low sodium heart healthy        12/30/23 1105           Regular diet DISCHARGE MEDICATION: Allergies as of 12/30/2023       Reactions   Bee Pollen Other (See Comments)   Uncontrollable sneezing        Medication List     STOP taking these medications    omeprazole 20 MG tablet Commonly known as: PRILOSEC OTC       TAKE these medications    ciprofloxacin  500 MG tablet Commonly known as: Cipro  Take 1 tablet (500 mg total) by mouth 2 (two) times daily for 4 days.   NORCO PO Take 1 tablet by mouth 2 (two) times daily as needed (severe pain).   ondansetron  4 MG disintegrating tablet Commonly known as: ZOFRAN -ODT Take 1 tablet (4 mg total)  by mouth every 8 (eight) hours as needed for nausea or vomiting.   pantoprazole  40 MG tablet Commonly known as: Protonix  Take 1 tablet (40 mg total) by mouth daily.   phosphorus [id number] MG tablet Commonly known as: K PHOS  NEUTRAL Take 2 tablets (500 mg total) by mouth 2 (two) times daily for 3 days.        Follow-up  Information     Maczis, Puja Gosai, PA-C. Call.   Specialty: General Surgery Why: Call to confirm appointment date/time in ~3 weeks for post-operative follow up. Contact information: [address] [facility] [place] [facility] [place] [address] [phone]                Discharge Exam: Filed Weights   12/27/23 1219 12/29/23 0349 12/30/23 0500  Weight: 69.9 kg 68.4 kg 68.3 kg   Physical Exam on Day of Discharge   General: Alert, cheerful, oriented X3, in some pain  Oral cavity: moist mucous membranes  Neck: supple  Chest: clear to auscultation. No crackles, no wheezes  CVS: S1,S2 RRR. No murmurs  Abd: No distention, soft, non-tender. No masses palpable  Extr: No edema    Condition at discharge: stable  The results of significant diagnostics from this hospitalization (including imaging, microbiology, ancillary and laboratory) are listed below for reference.   Imaging Studies: DG ERCP Result Date: 12/28/2023 CLINICAL DATA:  [id number] Surgery, elective [id number] EXAM: ERCP COMPARISON:  CT AP, 12/26/2023.  MRCP, 12/26/2023. FLUOROSCOPY: Exposure Index (as provided by the fluoroscopic device): 27.8 mGy Kerma FINDINGS: Limited oblique planar images of the RIGHT upper quadrant obtained C-arm. Images demonstrating flexible endoscopy, biliary duct cannulation, sphincterotomy, retrograde cholangiogram and balloon sweep. No biliary ductal dilation. No evidence of biliary filling defect is demonstrated. IMPRESSION: Fluoroscopic imaging for ERCP For complete description of intra procedural findings, please see performing service dictation. Electronically Signed   By: Thom Hall M.D.   On: 12/28/2023 11:23   MR ABDOMEN MRCP WO CONTRAST Result Date: 12/26/2023 CLINICAL DATA:  Cholecystitis EXAM: MRI ABDOMEN WITHOUT CONTRAST  (INCLUDING MRCP) TECHNIQUE: Multiplanar multisequence MR imaging of the abdomen was performed. Heavily T2-weighted images of the biliary and pancreatic  ducts were obtained, and three-dimensional MRCP images were rendered by post processing. COMPARISON:  CT and ultrasound 12/26/2023 FINDINGS: Lower chest: No acute pleural or parenchymal lung disease. Hepatobiliary: Multiple gallstones are again identified, with gallbladder wall thickening and pericholecystic fat stranding consistent with acute cholecystitis. There is intrahepatic and extrahepatic biliary duct dilation, with common bile duct measuring up to 12 mm in diameter. There is an obstructing 8 mm calculus within the downstream common bile duct just proximal to the ampulla, reference image 9/series 16. Liver parenchyma demonstrates normal signal characteristics without focal abnormality. Pancreas: Interstitial edema is seen throughout the pancreatic parenchyma, with peripancreatic fat stranding and free fluid consistent with acute pancreatitis. No pancreatic duct dilation. No fluid collection, pseudocyst, or abscess. Spleen:  Within normal limits in size and appearance. Adrenals/Urinary Tract: No masses identified. No evidence of hydronephrosis. Stomach/Bowel: Visualized portions within the abdomen are unremarkable. Vascular/Lymphatic: No pathologically enlarged lymph nodes identified. No abdominal aortic aneurysm demonstrated. Other:  Small volume ascites within the upper abdomen. Musculoskeletal: No suspicious bone lesions identified. IMPRESSION: 1. Cholelithiasis, with gallbladder wall thickening and pericholecystic fluid consistent with acute cholecystitis. 2. Obstructing 8 mm calculus within the downstream common bile duct just proximal to the ampulla, with intrahepatic and extrahepatic biliary duct dilation. Common bile duct measuring up to 12 mm in diameter.  3. Diffuse pancreatic edema and peripancreatic fat stranding consistent with acute pancreatitis. No fluid collection, pseudocyst, or abscess. Electronically Signed   By: Ozell Daring M.D.   On: 12/26/2023 21:02   MR 3D Recon At Scanner Result  Date: 12/26/2023 CLINICAL DATA:  Cholecystitis EXAM: MRI ABDOMEN WITHOUT CONTRAST  (INCLUDING MRCP) TECHNIQUE: Multiplanar multisequence MR imaging of the abdomen was performed. Heavily T2-weighted images of the biliary and pancreatic ducts were obtained, and three-dimensional MRCP images were rendered by post processing. COMPARISON:  CT and ultrasound 12/26/2023 FINDINGS: Lower chest: No acute pleural or parenchymal lung disease. Hepatobiliary: Multiple gallstones are again identified, with gallbladder wall thickening and pericholecystic fat stranding consistent with acute cholecystitis. There is intrahepatic and extrahepatic biliary duct dilation, with common bile duct measuring up to 12 mm in diameter. There is an obstructing 8 mm calculus within the downstream common bile duct just proximal to the ampulla, reference image 9/series 16. Liver parenchyma demonstrates normal signal characteristics without focal abnormality. Pancreas: Interstitial edema is seen throughout the pancreatic parenchyma, with peripancreatic fat stranding and free fluid consistent with acute pancreatitis. No pancreatic duct dilation. No fluid collection, pseudocyst, or abscess. Spleen:  Within normal limits in size and appearance. Adrenals/Urinary Tract: No masses identified. No evidence of hydronephrosis. Stomach/Bowel: Visualized portions within the abdomen are unremarkable. Vascular/Lymphatic: No pathologically enlarged lymph nodes identified. No abdominal aortic aneurysm demonstrated. Other:  Small volume ascites within the upper abdomen. Musculoskeletal: No suspicious bone lesions identified. IMPRESSION: 1. Cholelithiasis, with gallbladder wall thickening and pericholecystic fluid consistent with acute cholecystitis. 2. Obstructing 8 mm calculus within the downstream common bile duct just proximal to the ampulla, with intrahepatic and extrahepatic biliary duct dilation. Common bile duct measuring up to 12 mm in diameter. 3. Diffuse  pancreatic edema and peripancreatic fat stranding consistent with acute pancreatitis. No fluid collection, pseudocyst, or abscess. Electronically Signed   By: Ozell Daring M.D.   On: 12/26/2023 21:02   US  ABDOMEN LIMITED RUQ (LIVER/GB) Result Date: 12/26/2023 CLINICAL DATA:  Right upper quadrant abdominal pain. EXAM: ULTRASOUND ABDOMEN LIMITED RIGHT UPPER QUADRANT COMPARISON:  CT scan, same date. FINDINGS: Gallbladder: The gallbladder demonstrates marked wall thickening and contraction. Shadowing gallstones are also noted. Small amount pericholecystic fluid. Sonographer describes a sonographic Murphy sign. Common bile duct: Diameter: 11 mm Liver: No focal lesion identified. Within normal limits in parenchymal echogenicity. Portal vein is patent on color Doppler imaging with normal direction of blood flow towards the liver. Other: None. IMPRESSION: 1. Cholelithiasis with marked gallbladder wall thickening and pericholecystic fluid. Findings consistent with acute cholecystitis. 2. Dilated common bile duct measuring 11 mm. 3. Normal sonographic appearance of the liver. Electronically Signed   By: MYRTIS Stammer M.D.   On: 12/26/2023 18:58   CT ABDOMEN PELVIS W CONTRAST Result Date: 12/26/2023 EXAM: CT ABDOMEN AND PELVIS WITH CONTRAST 12/26/2023 06:06:23 PM TECHNIQUE: CT of the abdomen and pelvis was performed with the administration of 100 mL of iohexol  (OMNIPAQUE ) 300 MG/ML solution. Multiplanar reformatted images are provided for review. Automated exposure control, iterative reconstruction, and/or weight-based adjustment of the mA/kV was utilized to reduce the radiation dose to as low as reasonably achievable. COMPARISON: 10/19/2023 CLINICAL HISTORY: epigastric abdominal pain FINDINGS: LOWER CHEST: No acute abnormality. LIVER: Mild intrahepatic biliary ductal dilatation. GALLBLADDER AND BILE DUCTS: Gallbladder wall thickening. Mild extrahepatic biliary ductal dilatation. Common bile duct measures 11 mm. No  visible distal obstructing stone or process. SPLEEN: No acute abnormality. PANCREAS: Mild haziness noted around the pancreatic body and tail  compatible with acute pancreatitis. ADRENAL GLANDS: No acute abnormality. KIDNEYS, URETERS AND BLADDER: No stones in the kidneys or ureters. No hydronephrosis. No perinephric or periureteral stranding. Urinary bladder is unremarkable. GI AND BOWEL: Stomach demonstrates no acute abnormality. There is no bowel obstruction. Normal appendix. PERITONEUM AND RETROPERITONEUM: No ascites. No free air. VASCULATURE: Aorta is normal in caliber. LYMPH NODES: No lymphadenopathy. REPRODUCTIVE ORGANS: Left pelvic mass again noted, compatible with the lower uterine segment fibroid seen on prior ultrasound. This measures 5.6 x 5.3 cm with central calcifications, unchanged. BONES AND SOFT TISSUES: No acute osseous abnormality. No focal soft tissue abnormality. IMPRESSION: 1. Mild haziness around the pancreatic body and tail compatible with acute pancreatitis. 2. Gallbladder wall thickening with mild intrahepatic and extrahepatic biliary ductal dilatation, with a common bile duct diameter of 11 mm and no visible distal obstructing stone or process. This could be further evaluated with right upper quadrant ultrasound to exclude cholecystitis if felt clinically indicated. Electronically signed by: Franky Crease MD 12/26/2023 06:19 PM EST RP Workstation: [id number]    Microbiology: Results for orders placed or performed during the hospital encounter of 12/27/23  Surgical PCR screen     Status: None   Collection Time: 12/27/23  2:27 AM   Specimen: Nasal Mucosa; Nasal Swab  Result Value Ref Range Status   MRSA, PCR NEGATIVE NEGATIVE Final   Staphylococcus aureus NEGATIVE NEGATIVE Final    Comment: (NOTE) The Xpert SA Assay (FDA approved for NASAL specimens in patients 84 years of age and older), is one component of a comprehensive surveillance program. It is not intended to diagnose  infection nor to guide or monitor treatment. Performed at [facility], [address] [place], [place] [address]     Labs: CBC: Recent Labs  Lab 12/26/23 1624 12/27/23 0319 12/28/23 0356 12/29/23 0434 12/30/23 0453  WBC 14.9* 18.3* 7.7 11.1* 6.5  HGB 12.5 11.2* 9.2* 9.2* 9.9*  HCT 38.0 33.9* 28.1* 28.2* 29.9*  MCV 85.0 85.2 86.2 86.5 86.2  PLT 228 219 151 204 212   Basic Metabolic Panel: Recent Labs  Lab 12/26/23 1624 12/27/23 0319 12/28/23 0356 12/29/23 0434 12/30/23 0453  NA 141 138 140 139 137  K 3.7 3.3* 4.1 3.7 3.4*  CL 99 103 104 106 104  CO2 26 26 23 24  18*  GLUCOSE 114* 109* 109* 96 84  BUN 12 13 8 10  <5*  CREATININE 0.97 1.11* 0.74 0.73 0.84  CALCIUM 9.5 8.2* 8.6* 8.2* 8.1*  MG  --  1.8 2.2 2.0 1.9  PHOS  --  4.2 2.4* 2.3* 2.2*   Liver Function Tests: Recent Labs  Lab 12/26/23 1624 12/27/23 0319 12/28/23 0356 12/29/23 0434 12/30/23 0453  AST 238* 122* 50* 40 29  ALT 253* 163* 96* 81* 63*  ALKPHOS 250* 162* 118 121 110  BILITOT 2.9* 2.2* 1.2 0.9 1.0  PROT 8.3* 7.0 6.0* 6.3* 6.7  ALBUMIN 4.5 3.1* 2.5* 2.6* 2.8*   CBG: Recent Labs  Lab 12/27/23 0236  GLUCAP 100*    Discharge time spent: greater than 30 minutes.  Signed: MDALA-GAUSI, Jeffery Gammell AGATHA, MD [facility] 12/30/2023

## 2023-12-30 NOTE — Plan of Care (Signed)

## 2023-12-30 NOTE — Progress Notes (Signed)
 Discharge Nurse Summary: DC order noted per MD. DC RN at bedside with patient. Patient agreeable with discharge plan, states family will arrive soon for pickup. AVS printed/reviewed. PIV removed, skin intact. No DME needs. No home meds. TOC meds in progress, pending pickup. CP/Edu resolved. Telemonitor not present on assessment. All belongings accounted for. Wound CDI w/o bleeding or drainage. See LDAs.   Primary RN informed regarding discharge progress.   Rosario EMERSON Lund, RN

## 2023-12-30 NOTE — Progress Notes (Signed)
 Progress Note  2 Days Post-Op  Subjective: Patient denies pain. Having minimal nausea. Tolerating PO intake. Had BM this AM and flatulence. Denies vomiting.   ROS  All negative with the exception of above.  Objective: Vital signs in last 24 hours: Temp:  [98.5 F (36.9 C)-98.8 F (37.1 C)] 98.5 F (36.9 C) (12/09 0532) Pulse Rate:  [82-87] 82 (12/09 0532) Resp:  [16-17] 16 (12/09 0532) BP: (102-127)/(66-83) 119/78 (12/09 0532) SpO2:  [96 %-99 %] 98 % (12/09 0532) Weight:  [68.3 kg] 68.3 kg (12/09 0500) Last BM Date : 12/29/23  Intake/Output from previous day: 12/08 0701 - 12/09 0700 In: 960 [P.O.:960] Out: -  Intake/Output this shift: No intake/output data recorded.  PE: General: Pleasant female who is laying in bed in NAD. HEENT: Head is normocephalic, atraumatic. Sclera are noninjected. EOMI. Heart: HR normal during encounter.  Lungs: Respiratory effort nonlabored. Abd: Soft with mild distention. Generalized tenderness to palpation. Incisions C/D/I with dermabond. No rebound tenderness or guarding.  MS: Able to move all 4 extremities.  Skin: Warm and dry.  Psych: A&Ox3 with an appropriate affect.    Lab Results:  Recent Labs    12/29/23 0434 12/30/23 0453  WBC 11.1* 6.5  HGB 9.2* 9.9*  HCT 28.2* 29.9*  PLT 204 212   BMET Recent Labs    12/29/23 0434 12/30/23 0453  NA 139 137  K 3.7 3.4*  CL 106 104  CO2 24 18*  GLUCOSE 96 84  BUN 10 <5*  CREATININE 0.73 0.84  CALCIUM 8.2* 8.1*   PT/INR No results for input(s): LABPROT, INR in the last 72 hours. CMP     Component Value Date/Time   NA 137 12/30/2023 0453   K 3.4 (L) 12/30/2023 0453   CL 104 12/30/2023 0453   CO2 18 (L) 12/30/2023 0453   GLUCOSE 84 12/30/2023 0453   BUN <5 (L) 12/30/2023 0453   CREATININE 0.84 12/30/2023 0453   CALCIUM 8.1 (L) 12/30/2023 0453   PROT 6.7 12/30/2023 0453   ALBUMIN 2.8 (L) 12/30/2023 0453   AST 29 12/30/2023 0453   ALT 63 (H) 12/30/2023 0453    ALKPHOS 110 12/30/2023 0453   BILITOT 1.0 12/30/2023 0453   GFRNONAA >60 12/30/2023 0453   GFRAA >60 11/20/2014 2330   Lipase     Component Value Date/Time   LIPASE 146 (H) 12/28/2023 0356       Studies/Results: No results found.  Anti-infectives: Anti-infectives (From admission, onward)    Start     Dose/Rate Route Frequency Ordered Stop   12/29/23 1515  piperacillin -tazobactam (ZOSYN ) IVPB 3.375 g        3.375 g 12.5 mL/hr over 240 Minutes Intravenous Every 8 hours 12/29/23 1425     12/27/23 0400  piperacillin -tazobactam (ZOSYN ) IVPB 3.375 g  Status:  Discontinued        3.375 g 12.5 mL/hr over 240 Minutes Intravenous Every 8 hours 12/27/23 0255 12/28/23 1056        Assessment/Plan POD2: S/P LAPAROSCOPIC CHOLECYSTECTOMY, INDOCYANINE GREEN  FLUORESCENCE IMAGING by Dr. Stevie 12/28/2023 -ERCP on 12/6 -Afebrile -WBC 6.5 from 11.1; HGB stable 9.9 from 9.2 -Pain well controlled. Tolerating regular diet and having bowel function. Minimal nausea. No vomiting. -Reviewed post-operative expectations and restrictions in great detail. Work physicist, medical provided. Follow up communication has been sent. Patient is aware to call and confirm her outpatient follow up visit. All questions were answered.   FEN: Regular; IVF per primary team VTE: Lovenox  ID: Zosyn   LOS: 3 days   I reviewed specialist notes, hospitalist notes, nursing notes, last 24 h vitals and pain scores, last 48 h intake and output, last 24 h labs and trends, and last 24 h imaging results.   Marjorie Carlyon Favre, [facility] 12/30/2023, 8:06 AM Please see Amion for pager number during day hours 7:00am-4:30pm

## 2023-12-31 ENCOUNTER — Ambulatory Visit: Payer: Self-pay | Admitting: Gastroenterology

## 2023-12-31 LAB — CULTURE, BLOOD (ROUTINE X 2): Culture: NO GROWTH

## 2023-12-31 LAB — SURGICAL PATHOLOGY

## 2024-02-26 ENCOUNTER — Other Ambulatory Visit (HOSPITAL_BASED_OUTPATIENT_CLINIC_OR_DEPARTMENT_OTHER): Payer: Self-pay
